# Patient Record
Sex: Female | Born: 1955
Health system: Southern US, Community
[De-identification: ages and names within clinical notes are randomized; demographics above are authoritative.]

## PROBLEM LIST (undated history)

## (undated) DIAGNOSIS — F329 Major depressive disorder, single episode, unspecified: Secondary | ICD-10-CM

## (undated) DIAGNOSIS — F32A Depression, unspecified: Secondary | ICD-10-CM

## (undated) DIAGNOSIS — D242 Benign neoplasm of left breast: Secondary | ICD-10-CM

## (undated) DIAGNOSIS — D72829 Elevated white blood cell count, unspecified: Secondary | ICD-10-CM

## (undated) DIAGNOSIS — D649 Anemia, unspecified: Secondary | ICD-10-CM

## (undated) DIAGNOSIS — M1990 Unspecified osteoarthritis, unspecified site: Secondary | ICD-10-CM

## (undated) DIAGNOSIS — Z9889 Other specified postprocedural states: Secondary | ICD-10-CM

## (undated) DIAGNOSIS — E039 Hypothyroidism, unspecified: Secondary | ICD-10-CM

## (undated) DIAGNOSIS — R112 Nausea with vomiting, unspecified: Secondary | ICD-10-CM

## (undated) HISTORY — DX: Major depressive disorder, single episode, unspecified: F32.9

## (undated) HISTORY — DX: Hypothyroidism, unspecified: E03.9

## (undated) HISTORY — DX: Elevated white blood cell count, unspecified: D72.829

## (undated) HISTORY — PX: ABDOMINAL HYSTERECTOMY: SHX81

## (undated) HISTORY — PX: COLONOSCOPY: SHX174

## (undated) HISTORY — DX: Depression, unspecified: F32.A

---

## 1997-09-30 ENCOUNTER — Ambulatory Visit (HOSPITAL_COMMUNITY): Admission: RE | Admit: 1997-09-30 | Discharge: 1997-09-30 | Payer: Self-pay | Admitting: Obstetrics and Gynecology

## 1998-10-02 ENCOUNTER — Ambulatory Visit (HOSPITAL_COMMUNITY): Admission: RE | Admit: 1998-10-02 | Discharge: 1998-10-02 | Payer: Self-pay | Admitting: Obstetrics and Gynecology

## 1998-12-21 ENCOUNTER — Encounter (INDEPENDENT_AMBULATORY_CARE_PROVIDER_SITE_OTHER): Payer: Self-pay | Admitting: *Deleted

## 1998-12-21 ENCOUNTER — Ambulatory Visit (HOSPITAL_COMMUNITY): Admission: RE | Admit: 1998-12-21 | Discharge: 1998-12-21 | Payer: Self-pay | Admitting: Obstetrics and Gynecology

## 1999-10-12 ENCOUNTER — Ambulatory Visit (HOSPITAL_COMMUNITY): Admission: RE | Admit: 1999-10-12 | Discharge: 1999-10-12 | Payer: Self-pay | Admitting: Obstetrics and Gynecology

## 1999-10-12 ENCOUNTER — Encounter: Payer: Self-pay | Admitting: Obstetrics and Gynecology

## 1999-10-30 ENCOUNTER — Encounter (INDEPENDENT_AMBULATORY_CARE_PROVIDER_SITE_OTHER): Payer: Self-pay | Admitting: Specialist

## 1999-10-30 ENCOUNTER — Observation Stay (HOSPITAL_COMMUNITY): Admission: RE | Admit: 1999-10-30 | Discharge: 1999-10-31 | Payer: Self-pay | Admitting: Obstetrics and Gynecology

## 2000-10-20 ENCOUNTER — Other Ambulatory Visit: Admission: RE | Admit: 2000-10-20 | Discharge: 2000-10-20 | Payer: Self-pay | Admitting: Obstetrics and Gynecology

## 2000-10-22 ENCOUNTER — Ambulatory Visit (HOSPITAL_COMMUNITY): Admission: RE | Admit: 2000-10-22 | Discharge: 2000-10-22 | Payer: Self-pay | Admitting: Obstetrics and Gynecology

## 2000-10-22 ENCOUNTER — Encounter: Payer: Self-pay | Admitting: Obstetrics and Gynecology

## 2001-12-01 ENCOUNTER — Ambulatory Visit (HOSPITAL_COMMUNITY): Admission: RE | Admit: 2001-12-01 | Discharge: 2001-12-01 | Payer: Self-pay | Admitting: Obstetrics and Gynecology

## 2001-12-01 ENCOUNTER — Encounter: Payer: Self-pay | Admitting: Obstetrics and Gynecology

## 2001-12-24 ENCOUNTER — Other Ambulatory Visit: Admission: RE | Admit: 2001-12-24 | Discharge: 2001-12-24 | Payer: Self-pay | Admitting: Obstetrics and Gynecology

## 2002-12-21 ENCOUNTER — Ambulatory Visit (HOSPITAL_COMMUNITY): Admission: RE | Admit: 2002-12-21 | Discharge: 2002-12-21 | Payer: Self-pay | Admitting: Obstetrics and Gynecology

## 2002-12-21 ENCOUNTER — Encounter: Payer: Self-pay | Admitting: Obstetrics and Gynecology

## 2004-01-17 ENCOUNTER — Ambulatory Visit (HOSPITAL_COMMUNITY): Admission: RE | Admit: 2004-01-17 | Discharge: 2004-01-17 | Payer: Self-pay | Admitting: Obstetrics and Gynecology

## 2004-01-26 ENCOUNTER — Encounter: Admission: RE | Admit: 2004-01-26 | Discharge: 2004-01-26 | Payer: Self-pay | Admitting: Obstetrics and Gynecology

## 2004-02-13 ENCOUNTER — Other Ambulatory Visit: Admission: RE | Admit: 2004-02-13 | Discharge: 2004-02-13 | Payer: Self-pay | Admitting: Obstetrics and Gynecology

## 2005-02-14 ENCOUNTER — Encounter: Admission: RE | Admit: 2005-02-14 | Discharge: 2005-02-14 | Payer: Self-pay | Admitting: Obstetrics and Gynecology

## 2005-06-03 ENCOUNTER — Other Ambulatory Visit: Admission: RE | Admit: 2005-06-03 | Discharge: 2005-06-03 | Payer: Self-pay | Admitting: Obstetrics and Gynecology

## 2006-07-29 ENCOUNTER — Ambulatory Visit (HOSPITAL_COMMUNITY): Admission: RE | Admit: 2006-07-29 | Discharge: 2006-07-29 | Payer: Self-pay | Admitting: Obstetrics and Gynecology

## 2006-08-06 ENCOUNTER — Encounter: Admission: RE | Admit: 2006-08-06 | Discharge: 2006-08-06 | Payer: Self-pay | Admitting: Obstetrics and Gynecology

## 2007-02-09 ENCOUNTER — Encounter: Admission: RE | Admit: 2007-02-09 | Discharge: 2007-02-09 | Payer: Self-pay | Admitting: Family Medicine

## 2007-07-30 ENCOUNTER — Encounter: Admission: RE | Admit: 2007-07-30 | Discharge: 2007-07-30 | Payer: Self-pay | Admitting: Obstetrics and Gynecology

## 2009-04-25 ENCOUNTER — Encounter: Admission: RE | Admit: 2009-04-25 | Discharge: 2009-04-25 | Payer: Self-pay | Admitting: Obstetrics and Gynecology

## 2010-07-24 ENCOUNTER — Other Ambulatory Visit: Payer: Self-pay | Admitting: Oncology

## 2010-07-24 ENCOUNTER — Encounter (HOSPITAL_BASED_OUTPATIENT_CLINIC_OR_DEPARTMENT_OTHER): Payer: BC Managed Care – PPO | Admitting: Oncology

## 2010-07-24 DIAGNOSIS — D72829 Elevated white blood cell count, unspecified: Secondary | ICD-10-CM

## 2010-07-24 LAB — COMPREHENSIVE METABOLIC PANEL
ALT: 15 U/L (ref 0–35)
AST: 15 U/L (ref 0–37)
CO2: 24 mEq/L (ref 19–32)
Calcium: 9.8 mg/dL (ref 8.4–10.5)
Chloride: 100 mEq/L (ref 96–112)
Creatinine, Ser: 0.71 mg/dL (ref 0.40–1.20)
Potassium: 3.8 mEq/L (ref 3.5–5.3)
Sodium: 138 mEq/L (ref 135–145)
Total Protein: 7.3 g/dL (ref 6.0–8.3)

## 2010-07-24 LAB — CHCC SMEAR

## 2010-07-24 LAB — CBC WITH DIFFERENTIAL/PLATELET
BASO%: 1.7 % (ref 0.0–2.0)
Basophils Absolute: 0.1 10*3/uL (ref 0.0–0.1)
Eosinophils Absolute: 0 10*3/uL (ref 0.0–0.5)
HGB: 13.6 g/dL (ref 11.6–15.9)
LYMPH%: 31.3 % (ref 14.0–49.7)
MCH: 30.9 pg (ref 25.1–34.0)
MONO%: 19.9 % — ABNORMAL HIGH (ref 0.0–14.0)
NEUT#: 2.8 10*3/uL (ref 1.5–6.5)
NEUT%: 46.3 % (ref 38.4–76.8)
RDW: 12.9 % (ref 11.2–14.5)
WBC: 6.1 10*3/uL (ref 3.9–10.3)

## 2010-07-26 ENCOUNTER — Encounter: Payer: Self-pay | Admitting: Oncology

## 2010-08-03 NOTE — Discharge Summary (Signed)
[facility]  Patient:    Teresa Floyd, Teresa Floyd                       MRN: [id number] Adm. Date:  [id number] Disc. Date: [id number] Attending:  Trevor Iha                           Discharge Summary  HISTORY OF PRESENT ILLNESS:   Ms. Teresa Floyd is a 55 year old, G4, P2, with hemorrhagic dysmenorrhea, missing two days of work at at time, feeling drained after her menses.  She has undergone a D&C with minimal success, declines oral contraceptive agents.  No treatment with anti-inflammatory medications.  She also has anemia.  She presents today for laparoscopic-assisted vaginal hysterectomy and bilateral salpingo-oophorectomy.  HOSPITAL COURSE:              The patient underwent LAVH-BSO with also lysis of adhesions.  The surgery was uncomplicated with estimated blood loss of 350 cc.  Her postoperative course was complicated only by a postoperative hemoglobin of 9.4, but platelets the following morning returned as 113,000 which was down from her preoperative platelets of 418,000.  Recheck later that evening showed platelets returned to 393,000, and the hemoglobin was 8.9.  She did have one temperature Teresa Floyd of 100.7 on the afternoon of October 31, 1999, but rapidly defervesced after ambulating and no signs of infection.  Bowel sounds were positive.  She tolerated a regular diet without difficulty and was ambulating well, and the patient was discharged home.  DISPOSITION:                  The patient will follow up in the hospital in two days for recheck of her CBC.  She was sent home with an prescription for Darvocet #30, Anaprox double strength, continue Climara 0.1 mg per day patch and continue her iron which she already has at home. DD:  10/31/99 TD:  11/01/99 Job: [id number]

## 2010-08-03 NOTE — Op Note (Signed)
[facility]  Patient:    Teresa Floyd, Teresa Floyd                       MRN: [id number] Proc. Date: 10/30/99 Adm. Date:  [id number] Disc. Date: [id number] Attending:  Trevor Iha                           Operative Report  PREOPERATIVE DIAGNOSIS:       Menorrhagia, dysmenorrhea and positive for fibroids.  POSTOPERATIVE DIAGNOSES:      1. Menorrhagia, dysmenorrhea and positive for                                  fibroids.                               2. Pelvic adhesive disease.  OPERATION:                    Laparoscopically assisted hysterectomy and bilateral salpingo-oophorectomy with lysis of adhesions.  SURGEON:                      Trevor Iha, M.D.  ASSISTANT:                    Duke Salvia. Marcelle Overlie, M.D.  ANESTHESIA:                   General endotracheal.  INDICATIONS:                  Teresa Floyd is a 55 year old G4, P2 with worsening menorrhagia, dysmenorrhea which is incapacitating for her not responsive to conservative medical management, also anemia. The patient desires definitive surgical intervention and presents today for a LAVHBSO. Risks and benefits were discussed at length.  Informed consent was obtained. See history and physical for further details.  FINDINGS:                     There were adhesions from the bladder to the uterus anteriorly and also the descending colon and epiploica were adhesed to the left tubo-ovarian complex.  This was sharply dissected.  Otherwise, there was a grossly enlarged uterus, normal-appearing ovaries and tubes, normal-appearing appendix and liver.  DESCRIPTION OF PROCEDURE:     After adequate general anesthesia, the patient was placed in the dorsal lithotomy position.  She was sterilely prepped and draped. Bladder was sterilely drained.  Hulka tenaculum was placed in the cervix.  A 1 cm infraumbilical skin incision was made and a Veress needle was inserted.  The abdomen was insufflated  with dullness to percussion.  An 11 mm trocar was inserted and two 5 mm trocars were inserted to the left of the midline to the pubic symphysis under direct visualization.  The above findings were noted.  Sharp endoshears were used to dissect the epiploica and bowel from the left tubo-ovarian complex and also to dissect the bladder from the anterior surface of the uterus and cervix.  At this time, endo-GIA was placed through the umbilical trocar.  A 5 mm laparoscope was inserted.  The right infundibulopelvic ligament was identified and the endo-GIA was placed across with care taken to avoid the ovary and the ureter.  After good placement was noted, the endo-GIA  was fired with good results noted.  A second endo-GIA was placed across the infundibulopelvic ligament down across the round ligament with good placement noted.  This was done similarly across the left infundibulopelvic ligament and the second endo-GIA was fired across the broad ligament down to the round ligament with good placement and care taken to avoid the ureter.  At this time, the abdomen was desufflated and we proceeded with the vaginal portion of the case.  A Jacobs tenaculum was placed on the posterior cervix.  A weighted speculum was placed in the vagina and a posterior colpotomy was performed. The cervix was circumscribed with Bovie cautery.  Heaney clips were placed across the uterosacral ligaments bilaterally. They were clamped, cut and tied using 0 Monocryl sutures.  The cardinal ligaments were then clamped, cut and tied bilaterally with 0 Monocryl.  The bladder was dissected over the anterior cervix.  The peritoneum was entered sharply and a Christain Sacramento was placed underneath the bladder and brought up through the ______, and then clamped, cut and tied again using 0 Monocryl.  Heaney clamps were then placed across the uterine vasculature, bilaterally clamped, cut and tied and across the inferior portion of the round  ligaments bilaterally.  They were clamped, cut and tied.  At this point, the tenaculum was placed on the fundus of the uterus.  It was delivered into the vagina and Heaney clamps were placed across the remaining portions of the broad ligament bilaterally.  They were clamped, cut and tied.  The uterus, tubes and ovaries were removed.  A wet pack was placed.  Good hemostasis was noted.  A suture of 0 Monocryl was placed across the uterosacral ligament bilaterally, clamped, cut and tied.  The posterior peritoneum was then closed in a pursestring fashion with 0 Monocryl.  The vagina was then closed sterilely in the inferior section in a vertical fashion with 0 Monocryl in a figure-of-eight fashion with good approximation.  The wet pack was then removed and the anterior portion of the vagina was then closed in a vertical fashion using figure-of-eight of 0 Monocryl with good approximation and good hemostasis.  The abdomen was reinsufflated.  Examination with the laparoscope revealed some peritoneal edges that were not hemostatic.  Hemostasis was achieved with bipolar cautery.  After good hemostasis was achieved and a copious amount of irrigation, the laparoscope was removed.  The skin incision was closed with 0 Vicryl on the fascia and 3-0 Vicryl Rapide subcuticular stitch.  The 5 mm trocar sites were closed with 3-0 Vicryl Rapide subcuticular stitch.  The incision was injected with 0.25% Marcaine.  The patient tolerated the procedure well and was stable on transfer to the recovery room.  The sponge and instrument counts were correct x 3.  The patient received 400 mg of Cipro IV preoperatively.  Estimated blood loss during the procedure was 350 cc. DD:  10/30/99 TD:  10/30/99 Job: [id number]

## 2010-08-03 NOTE — H&P (Signed)
[facility]  Patient:    Teresa Floyd, Teresa Floyd                         MRN: [id number] Adm. Date:  10/29/99 Attending:  Trevor Iha, M.D.                         History and Physical  HISTORY OF PRESENT ILLNESS:   [name] is a 55 year old, G4, P2 with continuing menorrhagia/dysmenorrhea.  She bleeds very heavily and has had much discomfort so much that she misses 1-2 days of work every time she has her period, feels drained after her period.  She has undergone D&C with minimal success.  She declines oral contraceptive agents, has no improvement with anti-inflammatory medications.  She does have a normal TSH.  She is currently on iron.  Does have a history of anemia due to the menorrhagia.  Her last hemoglobin was 11.0 on iron.  She presents for definitive surgical intervention.  Planned to proceed with laparoscopically-assisted vaginal hysterectomy with bilateral salpingo-oophorectomy.  PAST MEDICAL HISTORY:         Significant for a generalized anxiety disorder, doing well on Prozac.  PAST SURGICAL HISTORY:        She has had two cesarean sections and a tubal ligation.  She has also had several D&Cs in the past.  The most recent in October 2000.  MEDICATIONS:                  Prozac 20 mg a day, anti-inflammatory medications as needed.  SHE IS ALLERGIC TO PENICILLIN.  PHYSICAL EXAMINATION:  VITALS:                       Blood pressure is 100/68.  Weight is 175.  HEART:                        Regular rate and rhythm.  LUNGS:                        Clear to auscultation bilaterally.  ABDOMEN:                      Nondistended, nontender.  EXTREMITIES:                  Without clubbing, cyanosis, or edema.  GU:                           Normal external genitalia, Bartholin, Skene, urethra.  Uterus is anteverted ______ approximately 8-10 weeks size, good descensus with Valsalva.  Ultrasound shows an 11 x 6.1 x 4.7 cm uterus with multiple  fibroids noted within the wall, the largest measuring 2.5 cm in size. Normal-appearing ovaries.  No fluid in the cul-de-sac.  IMPRESSION/PLAN:              Probable fibroids, menorrhagia, dysmenorrhea, not responsive to conservative medical management.  Patient desires definitive surgical intervention, requests hysterectomy.  Lengthy discussion regarding bilateral salpingo-oophorectomy.  Patient, after having a long discussion regarding risks and benefits of removing the ovaries at age 86, she does desire to proceed with this.  Also have had a lengthy discussion with the patient with regards to hormone replacement therapy and she will start on hormones after the surgery.  Risks and  benefits were discussed at length, including, but not limited to risks of infection, bleeding, damage to bowel, bladder, ureters.  Also risk of blood loss, including risks associated with blood transfusion.  Patient does give her informed consent. DD:  10/29/99 TD:  10/29/99 Job: [id number]

## 2010-12-25 ENCOUNTER — Encounter: Payer: Self-pay | Admitting: *Deleted

## 2011-01-23 ENCOUNTER — Encounter: Payer: Self-pay | Admitting: *Deleted

## 2011-02-02 ENCOUNTER — Telehealth: Payer: Self-pay | Admitting: Oncology

## 2011-02-02 NOTE — Telephone Encounter (Signed)
Called pt to give new appt 03/26/11 @ 8.30am. Pt's phn rang then recording came on advising pt's vm box is full. I will mail pt an appt calendar for Jan 2013.

## 2011-03-18 ENCOUNTER — Encounter: Payer: Self-pay | Admitting: *Deleted

## 2011-03-22 ENCOUNTER — Other Ambulatory Visit: Payer: Self-pay | Admitting: Obstetrics and Gynecology

## 2011-03-22 DIAGNOSIS — N644 Mastodynia: Secondary | ICD-10-CM

## 2011-03-22 DIAGNOSIS — Z1231 Encounter for screening mammogram for malignant neoplasm of breast: Secondary | ICD-10-CM

## 2011-03-26 ENCOUNTER — Encounter: Payer: BC Managed Care – PPO | Admitting: Oncology

## 2011-03-26 ENCOUNTER — Other Ambulatory Visit: Payer: BC Managed Care – PPO | Admitting: Lab

## 2011-03-26 NOTE — Progress Notes (Deleted)
Hematology and Oncology Follow Up Visit  Teresa Floyd [id number] 11-May-1955 56 y.o. 03/26/2011 8:19 AM No primary provider on file.No ref. provider found   Principle Diagnosis: 56 year-old female with very slight monocytosis   Prior Therapy: none  Current therapy: observation  Interim History:   Medications: {medication reviewed/display:3041432} Current outpatient prescriptions:buPROPion (WELLBUTRIN SR) 100 MG 12 hr tablet, Take 100 mg by mouth daily.  , Disp: , Rfl: ;  estradiol (ESTRACE) 0.1 MG/GM vaginal cream, Place 2 g vaginally daily.  , Disp: , Rfl: ;  Estradiol (VIVELLE TD), Place onto the skin.  , Disp: , Rfl: ;  FLUoxetine (PROZAC) 10 MG tablet, Take 10 mg by mouth daily.  , Disp: , Rfl:  levothyroxine (SYNTHROID, LEVOTHROID) 125 MCG tablet, Take 125 mcg by mouth daily.  , Disp: , Rfl: ;  valACYclovir (VALTREX) 500 MG tablet, Take 500 mg by mouth daily.  , Disp: , Rfl:   Allergies:  Allergies  Allergen Reactions  . Penicillins     Past Medical History, Surgical history, Social history, and Family History were reviewed and updated.  Review of Systems: Constitutional:  Negative for fever, chills, night sweats, anorexia, weight loss, pain. Cardiovascular: {roscv:310661} Respiratory: {ros resp:310659} Neurological: {rosneuro:310663} Dermatological: {ros skin:310673} ENT: {rosent:310657} Skin: Negative. Gastrointestinal: {ros gi:310669} Genito-Urinary: {rosgu:310671} Hematological and Lymphatic: {rosheme/lymph:310665} Breast: {ros breast:311036} Musculoskeletal: {ros musculoskeletal:310677} Remaining ROS negative.  Physical Exam:  There were no vitals taken for this visit. ECOG:  General appearance: {general exam:16600} Head: {head exam:30909::"Normocephalic, without obvious abnormality","atraumatic"} Neck: {neck exam:17463::"no adenopathy","no carotid bruit","no JVD","supple, symmetrical, trachea midline","thyroid not enlarged, symmetric, no  tenderness/mass/nodules"} Lymph nodes: {lymph node exam:14039::"Cervical, supraclavicular, and axillary nodes normal."} Heart:{Exam; heart:5510} Lung:{Exam; lungs:5033::"Heart exam - S1, S2 normal, no murmur, no gallop, rate regular"} Abdomin: {Exam; abdomen:14935::"soft, non-tender, without masses or organomegaly"} EXT:{EXTREMITIES NEGATIVES KGMW:10272}   Lab Results:  No results found for this basename: WBC:5,HGB:5,HCT:5,PLT:5,MCV:5,MCH:5,MCHC:5,RDW:5,NEUTRABS:5,LYMPHSABS:5,MONOABS:5,EOSABS:5,BASOSABS:5,BANDABS:5,BANDSABD:5 in the last 168 hours  CMP   No results found for this basename: NA:5,K:5,CL:5,CO2:5,GLUCOSE:5,BUN:5,CREATININE:5,GFRCGP,:5,CALCIUM:5,MG:5,AST:5,ALT:5,ALKPHOS:5,BILITOT:5 in the last 168 hours      Component Value Date/Time   BILITOT 0.4 07/24/2010 1307    Radiological Studies:  No results found.   Impression and Plan:  ***  Spent more than half the time coordinating care.    Marcos Eke, MD 1/8/20138:19 AM

## 2011-03-27 NOTE — Progress Notes (Signed)
This encounter was created in error - please disregard.

## 2011-04-01 ENCOUNTER — Ambulatory Visit: Payer: BC Managed Care – PPO

## 2011-07-15 ENCOUNTER — Ambulatory Visit
Admission: RE | Admit: 2011-07-15 | Discharge: 2011-07-15 | Disposition: A | Discharge status: Home / Self Care | Payer: BC Managed Care – PPO | Source: Ambulatory Visit | Attending: Obstetrics and Gynecology | Admitting: Obstetrics and Gynecology

## 2011-07-15 DIAGNOSIS — N644 Mastodynia: Secondary | ICD-10-CM

## 2011-07-19 ENCOUNTER — Telehealth: Payer: Self-pay | Admitting: Oncology

## 2011-07-19 NOTE — Telephone Encounter (Signed)
Returned pt's call @ both numbers listed in EPIC re r/s appt but was not able to reach her. Pt asked to call back and let me know when she can come in.

## 2011-07-23 ENCOUNTER — Telehealth: Payer: Self-pay | Admitting: Oncology

## 2011-07-23 NOTE — Telephone Encounter (Signed)
l/m at wk and cell [phone],pt had l/m to set up appt  aom

## 2011-07-28 ENCOUNTER — Emergency Department (HOSPITAL_BASED_OUTPATIENT_CLINIC_OR_DEPARTMENT_OTHER)
Admission: EM | Admit: 2011-07-28 | Discharge: 2011-07-28 | Disposition: A | Discharge status: Home / Self Care | Payer: BC Managed Care – PPO | Attending: Emergency Medicine | Admitting: Emergency Medicine

## 2011-07-28 ENCOUNTER — Encounter (HOSPITAL_BASED_OUTPATIENT_CLINIC_OR_DEPARTMENT_OTHER): Payer: Self-pay | Admitting: *Deleted

## 2011-07-28 DIAGNOSIS — R21 Rash and other nonspecific skin eruption: Secondary | ICD-10-CM | POA: Insufficient documentation

## 2011-07-28 DIAGNOSIS — E039 Hypothyroidism, unspecified: Secondary | ICD-10-CM | POA: Insufficient documentation

## 2011-07-28 DIAGNOSIS — Z79899 Other long term (current) drug therapy: Secondary | ICD-10-CM | POA: Insufficient documentation

## 2011-07-28 DIAGNOSIS — L237 Allergic contact dermatitis due to plants, except food: Secondary | ICD-10-CM

## 2011-07-28 MED ORDER — PREDNISONE 50 MG PO TABS
60.0000 mg | ORAL_TABLET | Freq: Once | ORAL | Status: AC
  Administered 2011-07-28: 60 mg via ORAL
  Filled 2011-07-28: qty 1

## 2011-07-28 MED ORDER — PREDNISONE 10 MG PO TABS: 20.0000 mg | ORAL_TABLET | Freq: Every day | ORAL | Status: DC

## 2011-07-28 MED ORDER — DIPHENHYDRAMINE HCL 25 MG PO CAPS
25.0000 mg | ORAL_CAPSULE | Freq: Once | ORAL | Status: AC
  Administered 2011-07-28: 25 mg via ORAL
  Filled 2011-07-28 (×2): qty 1

## 2011-07-28 NOTE — ED Notes (Signed)
Pt presents to ED today with generalized rash over body.  Pt reports strong reaction to poision ivy and has hx of same.  Pt is requesting "steroid to help treat this"

## 2011-07-28 NOTE — ED Provider Notes (Signed)
History   This chart was scribed for Hilario Quarry, MD by Charolett Bumpers . The patient was seen in room MH11/MH11.    CSN: [id number]  Arrival date & time 07/28/11  2139   First MD Initiated Contact with Patient 07/28/11 2219      Chief Complaint  Patient presents with  . Rash    (Consider location/radiation/quality/duration/timing/severity/associated sxs/prior treatment) HPI Teresa Floyd is a 56 y.o. female who presents to the Emergency Department complaining of constant, moderate poison ivy rash on face, hands, forearms, and lower legs bilaterally. Patient states that she first noticed the rash 48 hours ago. Patient reports a h/o poison ivy outbreaks. Patient states that the rash has continued to spread and is worsening. Patient states that she took a generic antihistamine with some relief. Patient denies any pertinent medical hx or surgical hx.   PCP: Dr Tenny Craw  Past Medical History  Diagnosis Date  . Leukocytosis   . Hypothyroidism   . Depression   . Genital herpes     Past Surgical History  Procedure Date  . Abdominal hysterectomy     History reviewed. No pertinent family history.  History  Substance Use Topics  . Smoking status: Never Smoker   . Smokeless tobacco: Not on file  . Alcohol Use: No    OB History    Grav Para Term Preterm Abortions TAB SAB Ect Mult Living                  Review of Systems  Skin: Positive for rash.  All other systems reviewed and are negative.    Allergies  Penicillins  Home Medications   Current Outpatient Rx  Name Route Sig Dispense Refill  . BUPROPION HCL ER (SR) 100 MG PO TB12 Oral Take 100 mg by mouth daily.      Marland Kitchen ESTRADIOL 0.1 MG/GM VA CREA Vaginal Place 2 g vaginally daily.      Marland Kitchen ESTRADIOL 0.05 MG/24HR TD PTTW Transdermal Place 1 patch onto the skin once a week.    Marland Kitchen FLUOXETINE HCL 10 MG PO TABS Oral Take 10 mg by mouth daily.      Marland Kitchen LEVOTHYROXINE SODIUM 125 MCG PO TABS Oral Take 125 mcg by mouth  daily.      Marland Kitchen VALACYCLOVIR HCL 500 MG PO TABS Oral Take 500 mg by mouth daily.        BP 129/85  Pulse 94  Temp(Src) 98.3 F (36.8 C) (Oral)  Resp 20  Ht 5\' 3"  (1.6 m)  Wt 170 lb (77.111 kg)  BMI 30.11 kg/m2  SpO2 98%  Physical Exam  Nursing note and vitals reviewed. Constitutional: She is oriented to person, place, and time. She appears well-developed and well-nourished. No distress.  HENT:  Head: Normocephalic and atraumatic.  Eyes: EOM are normal. Pupils are equal, round, and reactive to light.  Neck: Normal range of motion. Neck supple. No tracheal deviation present.  Cardiovascular: Normal rate.   Pulmonary/Chest: Effort normal. No respiratory distress.  Abdominal: Soft. She exhibits no distension.  Musculoskeletal: Normal range of motion. She exhibits no edema.  Neurological: She is alert and oriented to person, place, and time. No sensory deficit.  Skin: Skin is warm and dry. Rash noted.       Rash on face, hands, forearms, and lower legs bilaterally consistent with poison ivy. Erythema and blisters noted throughout rash.   Psychiatric: She has a normal mood and affect. Her behavior is normal.  ED Course  Procedures (including critical care time)  DIAGNOSTIC STUDIES: Oxygen Saturation is 98% on room air, normal by my interpretation.    COORDINATION OF CARE:  2226: Discussed planned course of treatment with the patient who is agreeable at this time.  2230: Medication Orders: Diphenhydramine (Benadryl) capsule 25 mg-once; Prednisone (Deltasone) tablet 60 mg-once.   Labs Reviewed - No data to display No results found.   No diagnosis found.    MDM  I personally performed the services described in this documentation, which was scribed in my presence. The recorded information has been reviewed and considered.     Hilario Quarry, MD 07/28/11 [phone]

## 2011-07-28 NOTE — Discharge Instructions (Signed)
Poison Ivy Poison ivy is a rash caused by touching the leaves of the poison ivy plant. The rash often shows up 48 hours later. You might just have bumps, redness, and itching. Sometimes, blisters appear and break open. Your eyes may get puffy (swollen). Poison ivy often heals in 2 to 3 weeks without treatment. HOME CARE  If you touch poison ivy:   Wash your skin with soap and water right away. Wash under your fingernails. Do not rub the skin very hard.   Wash any clothes you were wearing.   Avoid poison ivy in the future. Poison ivy has 3 leaves on a stem.   Use medicine to help with itching as told by your doctor. Do not drive when you take this medicine.   Keep open sores dry, clean, and covered with a bandage and medicated cream, if needed.   Ask your doctor about medicine for children.  GET HELP RIGHT AWAY IF:  You have open sores.   Redness spreads beyond the area of the rash.   There is yellowish white fluid (pus) coming from the rash.   Pain gets worse.   You have a temperature by mouth above 102 F (38.9 C), not controlled by medicine.  MAKE SURE YOU:  Understand these instructions.   Will watch your condition.   Will get help right away if you are not doing well or get worse.  Document Released: 04/06/2010 Document Revised: 02/21/2011 Document Reviewed: 04/06/2010 [employer] Patient Information 2012 [employer]. 

## 2011-07-28 NOTE — ED Notes (Signed)
"  Poison ivy outbreak x 48 hours"

## 2011-08-01 ENCOUNTER — Telehealth: Payer: Self-pay | Admitting: Oncology

## 2011-08-01 NOTE — Telephone Encounter (Signed)
S/w pt re new appt for 5/21. appt r/s from jan per pt.

## 2011-08-06 ENCOUNTER — Ambulatory Visit (HOSPITAL_BASED_OUTPATIENT_CLINIC_OR_DEPARTMENT_OTHER): Payer: BC Managed Care – PPO | Admitting: Oncology

## 2011-08-06 ENCOUNTER — Other Ambulatory Visit: Payer: Self-pay | Admitting: Oncology

## 2011-08-06 ENCOUNTER — Other Ambulatory Visit: Payer: BC Managed Care – PPO | Admitting: Lab

## 2011-08-06 VITALS — BP 128/87 | HR 90 | Temp 98.4°F | Ht 61.5 in | Wt 164.2 lb

## 2011-08-06 DIAGNOSIS — D72821 Monocytosis (symptomatic): Secondary | ICD-10-CM

## 2011-08-06 LAB — CBC WITH DIFFERENTIAL/PLATELET
Eosinophils Absolute: 0 10*3/uL (ref 0.0–0.5)
HCT: 39.1 % (ref 34.8–46.6)
LYMPH%: 13.3 % — ABNORMAL LOW (ref 14.0–49.7)
MCH: 30.3 pg (ref 25.1–34.0)
MONO#: 0.5 10*3/uL (ref 0.1–0.9)
NEUT#: 7.8 10*3/uL — ABNORMAL HIGH (ref 1.5–6.5)
Platelets: 262 10*3/uL (ref 145–400)
lymph#: 1.3 10*3/uL (ref 0.9–3.3)
nRBC: 0 % (ref 0–0)

## 2011-08-06 LAB — CHCC SMEAR

## 2011-08-06 NOTE — Progress Notes (Signed)
Hematology and Oncology Follow Up Visit  Teresa Floyd [id number] 1955-12-01 56 y.o. 08/06/2011 3:47 PM   Principle Diagnosis: This is a 56 year old female with very slight monocytosis. Likely reactive noted in 07/2010.  Interim History:  Teresa Floyd presents for a follow up visit. I saw her back in 07/2011 and presented with monocytosis without any other hematological abnormalities. Her total WBC was normal. Since her last visit she has been doing well. She was diagnosed with poison IVY and currently on prednisone. No other complaints at this time. No recent infections or hospitalizations. No weight loss or any other symptoms.   Medications: I have reviewed the patient's current medications. Current outpatient prescriptions:buPROPion (WELLBUTRIN SR) 100 MG 12 hr tablet, Take 100 mg by mouth daily.  , Disp: , Rfl: ;  estradiol (ESTRACE) 0.1 MG/GM vaginal cream, Place 2 g vaginally daily.  , Disp: , Rfl: ;  estradiol (VIVELLE-DOT) 0.05 MG/24HR, Place 1 patch onto the skin once a week., Disp: , Rfl: ;  FLUoxetine (PROZAC) 10 MG tablet, Take 10 mg by mouth daily.  , Disp: , Rfl:  levothyroxine (SYNTHROID, LEVOTHROID) 125 MCG tablet, Take 125 mcg by mouth daily.  , Disp: , Rfl: ;  predniSONE (DELTASONE) 10 MG tablet, Take 2 tablets (20 mg total) by mouth daily., Disp: 15 tablet, Rfl: 0;  valACYclovir (VALTREX) 500 MG tablet, Take 500 mg by mouth daily.  , Disp: , Rfl:   Allergies:  Allergies  Allergen Reactions  . Penicillins Rash    Past Medical History, Surgical history, Social history, and Family History were reviewed and updated.  Review of Systems: Constitutional:  Negative for fever, chills, night sweats, anorexia, weight loss, pain. Cardiovascular: no chest pain or dyspnea on exertion Respiratory: no cough, shortness of breath, or wheezing Neurological: no TIA or stroke symptoms Dermatological: negative ENT: negative Skin: Negative. Gastrointestinal: no abdominal pain, change in bowel  habits, or black or bloody stools Genito-Urinary: no dysuria, trouble voiding, or hematuria Hematological and Lymphatic: negative Breast: negative Musculoskeletal: negative Remaining ROS negative. Physical Exam: Blood pressure 128/87, pulse 90, temperature 98.4 F (36.9 C), temperature source Oral, height 5' 1.5" (1.562 m), weight 164 lb 4 oz (74.503 kg). ECOG: 0 General appearance: alert Head: Normocephalic, without obvious abnormality, atraumatic Neck: no adenopathy, no carotid bruit, no JVD, supple, symmetrical, trachea midline and thyroid not enlarged, symmetric, no tenderness/mass/nodules Lymph nodes: Cervical, supraclavicular, and axillary nodes normal. Heart:regular rate and rhythm, S1, S2 normal, no murmur, click, rub or gallop Lung:chest clear, no wheezing, rales, normal symmetric air entry Abdomin: soft, non-tender, without masses or organomegaly EXT:no erythema, induration, or nodules   Lab Results: Lab Results  Component Value Date   WBC 9.7 08/06/2011   HGB 13.2 08/06/2011   HCT 39.1 08/06/2011   MCV 89.8 08/06/2011   PLT 262 08/06/2011     Chemistry      Component Value Date/Time   NA 138 07/24/2010 1307   K 3.8 07/24/2010 1307   CL 100 07/24/2010 1307   CO2 24 07/24/2010 1307   BUN 20 07/24/2010 1307   CREATININE 0.71 07/24/2010 1307      Component Value Date/Time   CALCIUM 9.8 07/24/2010 1307   ALKPHOS 62 07/24/2010 1307   AST 15 07/24/2010 1307   ALT 15 07/24/2010 1307   BILITOT 0.4 07/24/2010 1307       Impression and Plan:   This is a pleasant 56 year old female with very slight monocytosis which has resolved at this time.  Her  CBC today did show some mild neutrophilia likely related to prednisone at this time.  Overall, I feel there is no hematological condition here and likely reactive changes.  I will be happy to see her in the future as needed.     Eli Hose, MD 5/21/20133:47 PM

## 2012-06-25 ENCOUNTER — Other Ambulatory Visit: Payer: Self-pay

## 2012-06-25 DIAGNOSIS — Z1231 Encounter for screening mammogram for malignant neoplasm of breast: Secondary | ICD-10-CM

## 2012-07-27 ENCOUNTER — Ambulatory Visit: Payer: BC Managed Care – PPO

## 2012-08-31 ENCOUNTER — Ambulatory Visit: Payer: BC Managed Care – PPO

## 2012-10-05 ENCOUNTER — Ambulatory Visit: Payer: BC Managed Care – PPO

## 2012-10-26 ENCOUNTER — Ambulatory Visit: Payer: BC Managed Care – PPO

## 2012-11-23 ENCOUNTER — Ambulatory Visit
Admission: RE | Admit: 2012-11-23 | Discharge: 2012-11-23 | Disposition: A | Discharge status: Home / Self Care | Payer: BC Managed Care – PPO | Source: Ambulatory Visit

## 2012-11-23 DIAGNOSIS — Z1231 Encounter for screening mammogram for malignant neoplasm of breast: Secondary | ICD-10-CM

## 2012-11-25 ENCOUNTER — Other Ambulatory Visit: Payer: Self-pay | Admitting: Obstetrics and Gynecology

## 2012-11-25 DIAGNOSIS — R928 Other abnormal and inconclusive findings on diagnostic imaging of breast: Secondary | ICD-10-CM

## 2012-12-08 ENCOUNTER — Ambulatory Visit
Admission: RE | Admit: 2012-12-08 | Discharge: 2012-12-08 | Disposition: A | Discharge status: Home / Self Care | Payer: BC Managed Care – PPO | Source: Ambulatory Visit | Attending: Obstetrics and Gynecology | Admitting: Obstetrics and Gynecology

## 2012-12-08 DIAGNOSIS — R928 Other abnormal and inconclusive findings on diagnostic imaging of breast: Secondary | ICD-10-CM

## 2013-12-15 ENCOUNTER — Other Ambulatory Visit: Payer: Self-pay

## 2013-12-15 DIAGNOSIS — Z1231 Encounter for screening mammogram for malignant neoplasm of breast: Secondary | ICD-10-CM

## 2013-12-23 ENCOUNTER — Ambulatory Visit
Admission: RE | Admit: 2013-12-23 | Discharge: 2013-12-23 | Disposition: A | Discharge status: Home / Self Care | Payer: BC Managed Care – PPO | Source: Ambulatory Visit

## 2013-12-23 DIAGNOSIS — Z1231 Encounter for screening mammogram for malignant neoplasm of breast: Secondary | ICD-10-CM

## 2015-02-13 ENCOUNTER — Other Ambulatory Visit: Payer: Self-pay

## 2015-02-13 DIAGNOSIS — Z1231 Encounter for screening mammogram for malignant neoplasm of breast: Secondary | ICD-10-CM

## 2015-03-22 ENCOUNTER — Ambulatory Visit
Admission: RE | Admit: 2015-03-22 | Discharge: 2015-03-22 | Disposition: A | Discharge status: Home / Self Care | Payer: BLUE CROSS/BLUE SHIELD | Source: Ambulatory Visit

## 2015-03-22 DIAGNOSIS — Z1231 Encounter for screening mammogram for malignant neoplasm of breast: Secondary | ICD-10-CM

## 2016-01-10 DIAGNOSIS — F4321 Adjustment disorder with depressed mood: Secondary | ICD-10-CM | POA: Diagnosis not present

## 2016-02-05 DIAGNOSIS — Z86010 Personal history of colonic polyps: Secondary | ICD-10-CM | POA: Diagnosis not present

## 2016-02-05 DIAGNOSIS — K5730 Diverticulosis of large intestine without perforation or abscess without bleeding: Secondary | ICD-10-CM | POA: Diagnosis not present

## 2016-03-19 ENCOUNTER — Other Ambulatory Visit: Payer: Self-pay | Admitting: Obstetrics and Gynecology

## 2016-03-19 DIAGNOSIS — Z1231 Encounter for screening mammogram for malignant neoplasm of breast: Secondary | ICD-10-CM

## 2016-04-11 ENCOUNTER — Ambulatory Visit: Payer: BLUE CROSS/BLUE SHIELD

## 2016-04-25 DIAGNOSIS — Z6829 Body mass index (BMI) 29.0-29.9, adult: Secondary | ICD-10-CM | POA: Diagnosis not present

## 2016-04-25 DIAGNOSIS — Z01419 Encounter for gynecological examination (general) (routine) without abnormal findings: Secondary | ICD-10-CM | POA: Diagnosis not present

## 2016-04-25 DIAGNOSIS — E039 Hypothyroidism, unspecified: Secondary | ICD-10-CM | POA: Diagnosis not present

## 2016-05-13 ENCOUNTER — Ambulatory Visit
Admission: RE | Admit: 2016-05-13 | Discharge: 2016-05-13 | Disposition: A | Discharge status: Home / Self Care | Payer: BLUE CROSS/BLUE SHIELD | Source: Ambulatory Visit | Attending: Obstetrics and Gynecology | Admitting: Obstetrics and Gynecology

## 2016-05-13 DIAGNOSIS — Z1231 Encounter for screening mammogram for malignant neoplasm of breast: Secondary | ICD-10-CM | POA: Diagnosis not present

## 2016-06-19 DIAGNOSIS — M25531 Pain in right wrist: Secondary | ICD-10-CM | POA: Diagnosis not present

## 2016-06-19 DIAGNOSIS — M79646 Pain in unspecified finger(s): Secondary | ICD-10-CM | POA: Diagnosis not present

## 2016-07-17 DIAGNOSIS — M25531 Pain in right wrist: Secondary | ICD-10-CM | POA: Diagnosis not present

## 2016-07-17 DIAGNOSIS — M79646 Pain in unspecified finger(s): Secondary | ICD-10-CM | POA: Diagnosis not present

## 2016-08-06 DIAGNOSIS — M79646 Pain in unspecified finger(s): Secondary | ICD-10-CM | POA: Diagnosis not present

## 2016-08-06 DIAGNOSIS — M25531 Pain in right wrist: Secondary | ICD-10-CM | POA: Diagnosis not present

## 2016-08-20 DIAGNOSIS — M25531 Pain in right wrist: Secondary | ICD-10-CM | POA: Diagnosis not present

## 2016-08-20 DIAGNOSIS — M79644 Pain in right finger(s): Secondary | ICD-10-CM | POA: Diagnosis not present

## 2016-08-20 DIAGNOSIS — M25532 Pain in left wrist: Secondary | ICD-10-CM | POA: Diagnosis not present

## 2016-08-20 DIAGNOSIS — M79646 Pain in unspecified finger(s): Secondary | ICD-10-CM | POA: Diagnosis not present

## 2016-09-02 DIAGNOSIS — M25531 Pain in right wrist: Secondary | ICD-10-CM | POA: Diagnosis not present

## 2016-09-02 DIAGNOSIS — M79646 Pain in unspecified finger(s): Secondary | ICD-10-CM | POA: Diagnosis not present

## 2016-09-11 DIAGNOSIS — J019 Acute sinusitis, unspecified: Secondary | ICD-10-CM | POA: Diagnosis not present

## 2016-10-29 DIAGNOSIS — M25511 Pain in right shoulder: Secondary | ICD-10-CM | POA: Diagnosis not present

## 2016-10-29 DIAGNOSIS — M25539 Pain in unspecified wrist: Secondary | ICD-10-CM | POA: Diagnosis not present

## 2016-12-30 DIAGNOSIS — R0789 Other chest pain: Secondary | ICD-10-CM | POA: Diagnosis not present

## 2017-01-13 DIAGNOSIS — E039 Hypothyroidism, unspecified: Secondary | ICD-10-CM | POA: Diagnosis not present

## 2017-01-13 DIAGNOSIS — R0789 Other chest pain: Secondary | ICD-10-CM | POA: Diagnosis not present

## 2017-01-13 DIAGNOSIS — R079 Chest pain, unspecified: Secondary | ICD-10-CM | POA: Diagnosis not present

## 2017-02-12 DIAGNOSIS — J069 Acute upper respiratory infection, unspecified: Secondary | ICD-10-CM | POA: Diagnosis not present

## 2017-02-18 DIAGNOSIS — M25531 Pain in right wrist: Secondary | ICD-10-CM | POA: Diagnosis not present

## 2017-03-03 DIAGNOSIS — J069 Acute upper respiratory infection, unspecified: Secondary | ICD-10-CM | POA: Diagnosis not present

## 2017-03-03 DIAGNOSIS — J0100 Acute maxillary sinusitis, unspecified: Secondary | ICD-10-CM | POA: Diagnosis not present

## 2017-03-17 DIAGNOSIS — J019 Acute sinusitis, unspecified: Secondary | ICD-10-CM | POA: Diagnosis not present

## 2017-03-25 DIAGNOSIS — M25539 Pain in unspecified wrist: Secondary | ICD-10-CM | POA: Diagnosis not present

## 2017-03-27 DIAGNOSIS — R05 Cough: Secondary | ICD-10-CM | POA: Diagnosis not present

## 2017-03-31 DIAGNOSIS — R05 Cough: Secondary | ICD-10-CM | POA: Diagnosis not present

## 2017-03-31 DIAGNOSIS — J320 Chronic maxillary sinusitis: Secondary | ICD-10-CM | POA: Diagnosis not present

## 2017-04-01 DIAGNOSIS — M25539 Pain in unspecified wrist: Secondary | ICD-10-CM | POA: Diagnosis not present

## 2017-04-07 ENCOUNTER — Other Ambulatory Visit: Payer: Self-pay | Admitting: Obstetrics and Gynecology

## 2017-04-07 DIAGNOSIS — Z139 Encounter for screening, unspecified: Secondary | ICD-10-CM

## 2017-04-08 DIAGNOSIS — M25539 Pain in unspecified wrist: Secondary | ICD-10-CM | POA: Diagnosis not present

## 2017-04-15 DIAGNOSIS — M25539 Pain in unspecified wrist: Secondary | ICD-10-CM | POA: Diagnosis not present

## 2017-04-22 DIAGNOSIS — M25539 Pain in unspecified wrist: Secondary | ICD-10-CM | POA: Diagnosis not present

## 2017-05-08 DIAGNOSIS — Z91018 Allergy to other foods: Secondary | ICD-10-CM | POA: Diagnosis not present

## 2017-05-14 DIAGNOSIS — L03031 Cellulitis of right toe: Secondary | ICD-10-CM | POA: Diagnosis not present

## 2017-05-14 DIAGNOSIS — M79674 Pain in right toe(s): Secondary | ICD-10-CM | POA: Diagnosis not present

## 2017-05-19 ENCOUNTER — Inpatient Hospital Stay: Admission: RE | Admit: 2017-05-19 | Payer: BLUE CROSS/BLUE SHIELD | Source: Ambulatory Visit

## 2017-05-28 DIAGNOSIS — M79674 Pain in right toe(s): Secondary | ICD-10-CM | POA: Diagnosis not present

## 2017-05-28 DIAGNOSIS — B351 Tinea unguium: Secondary | ICD-10-CM | POA: Diagnosis not present

## 2017-05-28 DIAGNOSIS — M79675 Pain in left toe(s): Secondary | ICD-10-CM | POA: Diagnosis not present

## 2017-06-02 DIAGNOSIS — R079 Chest pain, unspecified: Secondary | ICD-10-CM | POA: Diagnosis not present

## 2017-06-13 DIAGNOSIS — J309 Allergic rhinitis, unspecified: Secondary | ICD-10-CM | POA: Diagnosis not present

## 2017-06-13 DIAGNOSIS — T360X5A Adverse effect of penicillins, initial encounter: Secondary | ICD-10-CM | POA: Diagnosis not present

## 2017-06-13 DIAGNOSIS — T781XXA Other adverse food reactions, not elsewhere classified, initial encounter: Secondary | ICD-10-CM | POA: Diagnosis not present

## 2017-06-17 DIAGNOSIS — Z6832 Body mass index (BMI) 32.0-32.9, adult: Secondary | ICD-10-CM | POA: Diagnosis not present

## 2017-06-17 DIAGNOSIS — E039 Hypothyroidism, unspecified: Secondary | ICD-10-CM | POA: Diagnosis not present

## 2017-06-17 DIAGNOSIS — Z01419 Encounter for gynecological examination (general) (routine) without abnormal findings: Secondary | ICD-10-CM | POA: Diagnosis not present

## 2017-06-25 DIAGNOSIS — B351 Tinea unguium: Secondary | ICD-10-CM | POA: Diagnosis not present

## 2017-06-25 DIAGNOSIS — M79675 Pain in left toe(s): Secondary | ICD-10-CM | POA: Diagnosis not present

## 2017-06-25 DIAGNOSIS — M79674 Pain in right toe(s): Secondary | ICD-10-CM | POA: Diagnosis not present

## 2017-07-01 DIAGNOSIS — M25539 Pain in unspecified wrist: Secondary | ICD-10-CM | POA: Diagnosis not present

## 2017-07-28 DIAGNOSIS — M79675 Pain in left toe(s): Secondary | ICD-10-CM | POA: Diagnosis not present

## 2017-07-28 DIAGNOSIS — M79674 Pain in right toe(s): Secondary | ICD-10-CM | POA: Diagnosis not present

## 2017-07-28 DIAGNOSIS — B351 Tinea unguium: Secondary | ICD-10-CM | POA: Diagnosis not present

## 2017-07-29 DIAGNOSIS — M25539 Pain in unspecified wrist: Secondary | ICD-10-CM | POA: Diagnosis not present

## 2017-08-26 DIAGNOSIS — M25539 Pain in unspecified wrist: Secondary | ICD-10-CM | POA: Diagnosis not present

## 2017-08-26 DIAGNOSIS — Z880 Allergy status to penicillin: Secondary | ICD-10-CM | POA: Diagnosis not present

## 2017-08-26 DIAGNOSIS — T360X5D Adverse effect of penicillins, subsequent encounter: Secondary | ICD-10-CM | POA: Diagnosis not present

## 2017-08-26 DIAGNOSIS — J300 Vasomotor rhinitis: Secondary | ICD-10-CM | POA: Diagnosis not present

## 2017-08-27 DIAGNOSIS — B351 Tinea unguium: Secondary | ICD-10-CM | POA: Diagnosis not present

## 2017-08-27 DIAGNOSIS — M79674 Pain in right toe(s): Secondary | ICD-10-CM | POA: Diagnosis not present

## 2017-08-27 DIAGNOSIS — M79675 Pain in left toe(s): Secondary | ICD-10-CM | POA: Diagnosis not present

## 2017-09-01 ENCOUNTER — Ambulatory Visit: Payer: BLUE CROSS/BLUE SHIELD

## 2017-11-11 DIAGNOSIS — J019 Acute sinusitis, unspecified: Secondary | ICD-10-CM | POA: Diagnosis not present

## 2017-12-23 DIAGNOSIS — E039 Hypothyroidism, unspecified: Secondary | ICD-10-CM | POA: Diagnosis not present

## 2018-03-31 DIAGNOSIS — M25539 Pain in unspecified wrist: Secondary | ICD-10-CM | POA: Diagnosis not present

## 2018-04-14 DIAGNOSIS — M25539 Pain in unspecified wrist: Secondary | ICD-10-CM | POA: Diagnosis not present

## 2018-04-27 ENCOUNTER — Ambulatory Visit
Admission: RE | Admit: 2018-04-27 | Discharge: 2018-04-27 | Disposition: A | Discharge status: Home / Self Care | Payer: BLUE CROSS/BLUE SHIELD | Source: Ambulatory Visit | Attending: Obstetrics and Gynecology | Admitting: Obstetrics and Gynecology

## 2018-04-27 DIAGNOSIS — Z1231 Encounter for screening mammogram for malignant neoplasm of breast: Secondary | ICD-10-CM | POA: Diagnosis not present

## 2018-04-27 DIAGNOSIS — Z139 Encounter for screening, unspecified: Secondary | ICD-10-CM

## 2018-04-29 ENCOUNTER — Other Ambulatory Visit: Payer: Self-pay | Admitting: Obstetrics and Gynecology

## 2018-04-29 DIAGNOSIS — R928 Other abnormal and inconclusive findings on diagnostic imaging of breast: Secondary | ICD-10-CM

## 2018-05-05 ENCOUNTER — Ambulatory Visit
Admission: RE | Admit: 2018-05-05 | Discharge: 2018-05-05 | Disposition: A | Discharge status: Home / Self Care | Payer: BLUE CROSS/BLUE SHIELD | Source: Ambulatory Visit | Attending: Obstetrics and Gynecology | Admitting: Obstetrics and Gynecology

## 2018-05-05 ENCOUNTER — Other Ambulatory Visit: Payer: Self-pay | Admitting: Obstetrics and Gynecology

## 2018-05-05 DIAGNOSIS — D242 Benign neoplasm of left breast: Secondary | ICD-10-CM | POA: Diagnosis not present

## 2018-05-05 DIAGNOSIS — R921 Mammographic calcification found on diagnostic imaging of breast: Secondary | ICD-10-CM

## 2018-05-05 DIAGNOSIS — M25539 Pain in unspecified wrist: Secondary | ICD-10-CM | POA: Diagnosis not present

## 2018-05-05 DIAGNOSIS — R2232 Localized swelling, mass and lump, left upper limb: Secondary | ICD-10-CM

## 2018-05-05 DIAGNOSIS — R928 Other abnormal and inconclusive findings on diagnostic imaging of breast: Secondary | ICD-10-CM

## 2018-05-05 DIAGNOSIS — R920 Mammographic microcalcification found on diagnostic imaging of breast: Secondary | ICD-10-CM | POA: Diagnosis not present

## 2018-05-11 ENCOUNTER — Ambulatory Visit
Admission: RE | Admit: 2018-05-11 | Discharge: 2018-05-11 | Disposition: A | Discharge status: Home / Self Care | Payer: BLUE CROSS/BLUE SHIELD | Source: Ambulatory Visit | Attending: Obstetrics and Gynecology | Admitting: Obstetrics and Gynecology

## 2018-05-11 DIAGNOSIS — R2232 Localized swelling, mass and lump, left upper limb: Secondary | ICD-10-CM

## 2018-05-11 DIAGNOSIS — R928 Other abnormal and inconclusive findings on diagnostic imaging of breast: Secondary | ICD-10-CM | POA: Diagnosis not present

## 2018-05-11 DIAGNOSIS — N6489 Other specified disorders of breast: Secondary | ICD-10-CM | POA: Diagnosis not present

## 2018-05-12 ENCOUNTER — Other Ambulatory Visit: Payer: Self-pay | Admitting: General Surgery

## 2018-05-12 DIAGNOSIS — D242 Benign neoplasm of left breast: Secondary | ICD-10-CM | POA: Diagnosis not present

## 2018-05-12 DIAGNOSIS — M25539 Pain in unspecified wrist: Secondary | ICD-10-CM | POA: Diagnosis not present

## 2018-05-12 DIAGNOSIS — Z90710 Acquired absence of both cervix and uterus: Secondary | ICD-10-CM | POA: Diagnosis not present

## 2018-05-12 DIAGNOSIS — E039 Hypothyroidism, unspecified: Secondary | ICD-10-CM | POA: Diagnosis not present

## 2018-05-13 ENCOUNTER — Other Ambulatory Visit: Payer: Self-pay | Admitting: General Surgery

## 2018-05-13 DIAGNOSIS — D242 Benign neoplasm of left breast: Secondary | ICD-10-CM

## 2018-05-15 ENCOUNTER — Other Ambulatory Visit: Payer: Self-pay

## 2018-05-15 ENCOUNTER — Encounter (HOSPITAL_COMMUNITY): Payer: Self-pay

## 2018-05-15 ENCOUNTER — Ambulatory Visit
Admission: RE | Admit: 2018-05-15 | Discharge: 2018-05-15 | Disposition: A | Discharge status: Home / Self Care | Payer: BLUE CROSS/BLUE SHIELD | Source: Ambulatory Visit | Attending: General Surgery | Admitting: General Surgery

## 2018-05-15 ENCOUNTER — Encounter (HOSPITAL_COMMUNITY)
Admission: RE | Admit: 2018-05-15 | Discharge: 2018-05-15 | Disposition: A | Discharge status: Home / Self Care | Payer: BLUE CROSS/BLUE SHIELD | Source: Ambulatory Visit | Attending: General Surgery | Admitting: General Surgery

## 2018-05-15 DIAGNOSIS — D242 Benign neoplasm of left breast: Secondary | ICD-10-CM | POA: Diagnosis not present

## 2018-05-15 DIAGNOSIS — Z01812 Encounter for preprocedural laboratory examination: Secondary | ICD-10-CM | POA: Insufficient documentation

## 2018-05-15 HISTORY — DX: Other specified postprocedural states: R11.2

## 2018-05-15 HISTORY — DX: Anemia, unspecified: D64.9

## 2018-05-15 HISTORY — DX: Other specified postprocedural states: Z98.890

## 2018-05-15 HISTORY — DX: Unspecified osteoarthritis, unspecified site: M19.90

## 2018-05-15 LAB — CBC WITH DIFFERENTIAL/PLATELET
Abs Immature Granulocytes: 0.08 10*3/uL — ABNORMAL HIGH (ref 0.00–0.07)
Basophils Absolute: 0 10*3/uL (ref 0.0–0.1)
Basophils Relative: 0 %
Eosinophils Absolute: 0.1 10*3/uL (ref 0.0–0.5)
Eosinophils Relative: 1 %
HCT: 41.9 % (ref 36.0–46.0)
Hemoglobin: 13.4 g/dL (ref 12.0–15.0)
Immature Granulocytes: 1 %
LYMPHS ABS: 2.6 10*3/uL (ref 0.7–4.0)
Lymphocytes Relative: 29 %
MCH: 27.9 pg (ref 26.0–34.0)
MCHC: 32 g/dL (ref 30.0–36.0)
MCV: 87.3 fL (ref 80.0–100.0)
MONOS PCT: 19 %
Monocytes Absolute: 1.8 10*3/uL — ABNORMAL HIGH (ref 0.1–1.0)
Neutro Abs: 4.5 10*3/uL (ref 1.7–7.7)
Neutrophils Relative %: 50 %
Platelets: 282 10*3/uL (ref 150–400)
RBC: 4.8 MIL/uL (ref 3.87–5.11)
RDW: 14.9 % (ref 11.5–15.5)
WBC: 9 10*3/uL (ref 4.0–10.5)
nRBC: 0 % (ref 0.0–0.2)

## 2018-05-15 LAB — COMPREHENSIVE METABOLIC PANEL
ALK PHOS: 58 U/L (ref 38–126)
ALT: 23 U/L (ref 0–44)
AST: 18 U/L (ref 15–41)
Albumin: 4.1 g/dL (ref 3.5–5.0)
Anion gap: 8 (ref 5–15)
BUN: 14 mg/dL (ref 8–23)
CO2: 25 mmol/L (ref 22–32)
Calcium: 9.4 mg/dL (ref 8.9–10.3)
Chloride: 103 mmol/L (ref 98–111)
Creatinine, Ser: 0.6 mg/dL (ref 0.44–1.00)
GFR calc Af Amer: >60 mL/min (ref >60)
GFR calc non Af Amer: >60 mL/min (ref >60)
Glucose, Bld: 92 mg/dL (ref 70–99)
Potassium: 3.8 mmol/L (ref 3.5–5.1)
Sodium: 136 mmol/L (ref 135–145)
Total Bilirubin: 0.3 mg/dL (ref 0.3–1.2)
Total Protein: 6.4 g/dL — ABNORMAL LOW (ref 6.5–8.1)

## 2018-05-15 NOTE — Pre-Procedure Instructions (Signed)
Teresa Floyd  05/15/2018    Your procedure is scheduled on Tuesday, May 19, 2018 at 1:00 PM.   Report to [facility] Entrance "A" Admitting Office at 11:00 AM.   Call this number if you have problems the morning of surgery: [phone]   Questions prior to day of surgery, please call [phone] between 8 & 4 PM.   Remember:  Do not eat food after midnight Monday, 05/18/18  You may drink clear liquids until 10:00AM.  Clear liquids allowed are: Water, Juice (non-pulp), Carbonated beverages, Clear tea, Black coffee, plain Jello, plain Popsicles, Gatorade    Take these medicines the morning of surgery with A SIP OF WATER: Levothyroxine (Synthroid)  Stop NSAIDS (Aleve, Ibuprofen, etc) and Herbal medications as of today. Do not use Aspirin products (Goody's, BC Powders, etc) prior to surgery.    Do not wear jewelry, make-up or nail polish.  Do not wear lotions, powders, perfumes or deodorant.  Do not shave 48 hours prior to surgery.    Do not bring valuables to the hospital.  [facility] is not responsible for any belongings or valuables.  Contacts, dentures or bridgework may not be worn into surgery.  Leave your suitcase in the car.  After surgery it may be brought to your room.  For patients admitted to the hospital, discharge time will be determined by your treatment team.  Patients discharged the day of surgery will not be allowed to drive home.   [place] - Preparing for Surgery  Before surgery, you can play an important role.  Because skin is not sterile, your skin needs to be as free of germs as possible.  You can reduce the number of germs on you skin by washing with CHG (chlorahexidine gluconate) soap before surgery.  CHG is an antiseptic cleaner which kills germs and bonds with the skin to continue killing germs even after washing.  Oral Hygiene is also important in reducing the risk of infection.  Remember to brush your teeth with your regular toothpaste the  morning of surgery.  Please DO NOT use if you have an allergy to CHG or antibacterial soaps.  If your skin becomes reddened/irritated stop using the CHG and inform your nurse when you arrive at Short Stay.  Do not shave (including legs and underarms) for at least 48 hours prior to the first CHG shower.  You may shave your face.  Please follow these instructions carefully:   1.  Shower with CHG Soap the night before surgery and the morning of Surgery.  2.  If you choose to wash your hair, wash your hair first as usual with your normal shampoo.  3.  After you shampoo, rinse your hair and body thoroughly to remove the shampoo. 4.  Use CHG as you would any other liquid soap.  You can apply chg directly to the skin and wash gently with a      scrungie or washcloth.           5.  Apply the CHG Soap to your body ONLY FROM THE NECK DOWN.   Do not use on open wounds or open sores. Avoid contact with your eyes, ears, mouth and genitals (private parts).  Wash genitals (private parts) with your normal soap.  6.  Wash thoroughly, paying special attention to the area where your surgery will be performed.  7.  Thoroughly rinse your body with warm water from the neck down.  8.  DO NOT shower/wash with  your normal soap after using and rinsing off the CHG Soap.  9.  Pat yourself dry with a clean towel.            10.  Wear clean pajamas.            11.  Place clean sheets on your bed the night of your first shower and do not sleep with pets.  Day of Surgery  Shower as above. Do not apply any lotions/deodorants the morning of surgery.   Please wear clean clothes to the hospital. Remember to brush your teeth with toothpaste.   Please read over the fact sheets that you were given.

## 2018-05-15 NOTE — Progress Notes (Signed)
PCP - Dr. Milagros Evener Cardiologist - N/a  Chest x-ray - N/a EKG - N/a Stress Test - denies ECHO - denies Cardiac Cath - denies  Sleep Study - n/a CPAP -   Fasting Blood Sugar - n/a Checks Blood Sugar _____ times a day  Blood Thinner Instructions: n/a Aspirin Instructions: n/a  Anesthesia review: no  Patient denies shortness of breath, fever, cough and chest pain at PAT appointment   Patient verbalized understanding of instructions that were given to them at the PAT appointment. Patient was also instructed that they will need to review over the PAT instructions again at home before surgery.

## 2018-05-17 NOTE — H&P (Signed)
Lenard Forth Location: [facility] Patient #: [id number] DOB: 01-03-1956 Married / Language: English / Race: White Female        History of Present Illness  The patient is a 63 year old female who presents with a breast mass. This is a 63 year old female, referred by Dr. Louretta Shorten for surgical management of intraductal papilloma left breast, lower outer quadrant. [name] is her PCP. Referral initiated by Dr. Lovey Newcomer at the [place].     She has no prior history of breast problems. Recent screening mammograms and ultrasound show a 2.3 cm area of calcifications in the lower outer quadrant of the left breast. Ultrasound of the left axilla was negative. Image guided biopsy of the left breast distortion and calcification showed intraductal papilloma.     Past history reveals she is on hormone replacement therapy. Hypothyroidism. C-section 2. Vaginal hysterectomy and possibly had ovaries removed at the same time. Family history is negative for breast ovarian pancreatic or prostate cancer Social history she is married with 2 daughters. 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 Her home is in his abdomen that she goes there on the weekends. Her husband works in [place]. Denies tobacco. Alcohol occasionally      She is strongly motivated for the recommended excision of her papilloma. This is very reasonable as this is still one of her high risk lesions. She'll be scheduled for left breast lumpectomy with radioactive seed localization. I discussed indications, details, techniques, and risks of the surgery with her in detail. She is aware of the risk of bleeding, infection, cosmetic deformity, chronic pain, reoperation if this is cancer. She understands all these risks. All of her questions are answered. She agrees with this plan.     Past Surgical History  Cesarean Section - Multiple   Colon Polyp Removal - Colonoscopy  Hysterectomy (not due to cancer) - Complete   Diagnostic Studies History  Colonoscopy  1-5 years ago Mammogram  within last year Pap Smear  1-5 years ago  Allergies  No Known Drug Allergies  Allergies Reconciled   Medication History  FLUoxetine HCl (40MG  Capsule, Oral) Active. Probiotic (Oral) Active. Vitamin K2 (Oral) Specific strength unknown - Active. Estradiol (0.05MG /24HR Patch TW, Transdermal) Active. Levothyroxine Sodium (125MCG Tablet, Oral) Active. valACYclovir HCl (1GM Tablet, Oral) Active. Medications Reconciled  Social History  Alcohol use  Occasional alcohol use. Caffeine use  Coffee. No drug use  Tobacco use  Never smoker.  Family History  Alcohol Abuse  Brother, Father, Mother. Arthritis  Mother. Cancer  Father. Cerebrovascular Accident  Mother. Heart Disease  Mother. Heart disease in female family member before age 38  Hypertension  Mother. Thyroid problems  Mother.  Pregnancy / Birth History  Age at menarche  34 years. Age of menopause  24-55 Gravida  3 Irregular periods  Length (months) of breastfeeding  12-24 Maternal age  96-25 Para  2  Other Problems  Arthritis  Thyroid Disease     Review of Systems   Appetite Loss, Chills, Fatigue, Fever, Night Sweats, Weight Gain and Weight Loss. Skin Not Present- Change in Wart/Mole, Dryness, Hives, Jaundice, New Lesions, Non-Healing Wounds, Rash and Ulcer. HEENT Not Present- Earache, Hearing Loss, Hoarseness, Nose Bleed, Oral Ulcers, Ringing in the Ears, Seasonal Allergies, Sinus Pain, Sore Throat, Visual Disturbances, Wears glasses/contact lenses and Yellow Eyes. Respiratory Present- Snoring. Not Present- Bloody sputum, Chronic Cough, Difficulty Breathing and Wheezing.  Breast Present- Breast Mass. Not Present- Breast Pain, Nipple Discharge and Skin Changes. Cardiovascular Not Present- Chest Pain, Difficulty Breathing Lying Down,  Leg Cramps, Palpitations, Rapid Heart Rate, Shortness of Breath and Swelling of Extremities. Gastrointestinal Not Present- Abdominal Pain, Bloating, Bloody Stool, Change in Bowel Habits, Chronic diarrhea, Constipation, Difficulty Swallowing, Excessive gas, Gets full quickly at meals, Hemorrhoids, Indigestion, Nausea, Rectal Pain and Vomiting. Female Genitourinary Not Present- Frequency, Nocturia, Painful Urination, Pelvic Pain and Urgency. Musculoskeletal Present- Joint Pain. Not Present- Back Pain, Joint Stiffness, Muscle Pain, Muscle Weakness and Swelling of Extremities. Neurological Not Present- Decreased Memory, Fainting, Headaches, Numbness, Seizures, Tingling, Tremor, Trouble walking and Weakness. Psychiatric Not Present- Anxiety, Bipolar, Change in Sleep Pattern, Depression, Fearful and Frequent crying. Endocrine Not Present- Cold Intolerance, Excessive Hunger, Hair Changes, Heat Intolerance, Hot flashes and New Diabetes. Hematology Not Present- Blood Thinners, Easy Bruising, Excessive bleeding, Gland problems, HIV and Persistent Infections.  Vitals  Weight: 187 lb Height: 63in Body Surface Area: 1.88 m Body Mass Index: 33.13 kg/m  Temp.: 6F(Temporal)  Pulse: 76 (Regular)  P.OX: 97% (Room air) BP: 130/78 (Sitting, Left Arm, Standard)       Physical Exam  General Mental Status-Alert. General Appearance-Consistent with stated age. Hydration-Well hydrated. Voice-Normal.  Head and Neck Head-normocephalic, atraumatic with no lesions or palpable masses. Trachea-midline. Thyroid Gland Characteristics - normal size and consistency.  Eye Eyeball - Bilateral-Extraocular movements intact. Sclera/Conjunctiva - Bilateral-No scleral icterus.  Chest and Lung Exam Chest and lung exam reveals -quiet, even and easy respiratory effort with no use of accessory muscles and on auscultation, normal breath sounds, no adventitious sounds and normal vocal  resonance. Inspection Chest Wall - Normal. Back - normal.  Breast Note: Breasts are quite large. Needle biopsy site left breast very low outer quadrant. Just above inframammary crease. No hematoma. No mass in either breast. No axillary adenopathy.   Cardiovascular Cardiovascular examination reveals -normal heart sounds, regular rate and rhythm with no murmurs and normal pedal pulses bilaterally.  Abdomen Inspection Inspection of the abdomen reveals - No Hernias. Skin - Scar - Note: Pfannenstiel incision well healed. Palpation/Percussion Palpation and Percussion of the abdomen reveal - Soft, Non Tender, No Rebound tenderness, No Rigidity (guarding) and No hepatosplenomegaly. Auscultation Auscultation of the abdomen reveals - Bowel sounds normal.  Neurologic Neurologic evaluation reveals -alert and oriented x 3 with no impairment of recent or remote memory. Mental Status-Normal.  Musculoskeletal Normal Exam - Left-Upper Extremity Strength Normal and Lower Extremity Strength Normal. Normal Exam - Right-Upper Extremity Strength Normal and Lower Extremity Strength Normal.  Lymphatic Head & Neck  General Head & Neck Lymphatics: Bilateral - Description - Normal. Axillary  General Axillary Region: Bilateral - Description - Normal. Tenderness - Non Tender. Femoral & Inguinal  Generalized Femoral & Inguinal Lymphatics: Bilateral - Description - Normal. Tenderness - Non Tender.    Assessment & Plan  INTRADUCTAL PAPILLOMA OF BREAST, LEFT (D24.2)   Your recent imaging studies and biopsy showed an intraductal papilloma of the left breast, lower outer quadrant The area of distortion is 2.3 cm in size Ultrasound of the left axilla shows normal lymph nodes  Most likely this is benign, but there is at least a 5% chance that this could be upgraded to cancer once it is completely excised Excision is recommended You stated you fully agree and would like to go ahead with this  as soon as possible  you'll be scheduled for left breast lumpectomy with radioactive seed localization I discussed the indications, techniques,  and risk of the surgery in detail  HYPOTHYROIDISM, ADULT (E03.9) HISTORY OF VAGINAL HYSTERECTOMY (Z90.710) HISTORY OF C-SECTION (R04.136)    Edsel Petrin. Dalbert Batman, M.D., [facility] General and Minimally invasive Surgery Breast and Colorectal Surgery Office:   [phone] Pager:   [phone]

## 2018-05-19 ENCOUNTER — Ambulatory Visit (HOSPITAL_COMMUNITY): Payer: BLUE CROSS/BLUE SHIELD | Admitting: Certified Registered"

## 2018-05-19 ENCOUNTER — Ambulatory Visit
Admission: RE | Admit: 2018-05-19 | Discharge: 2018-05-19 | Disposition: A | Discharge status: Home / Self Care | Payer: BLUE CROSS/BLUE SHIELD | Source: Ambulatory Visit | Attending: General Surgery | Admitting: General Surgery

## 2018-05-19 ENCOUNTER — Encounter (HOSPITAL_COMMUNITY): Payer: Self-pay | Admitting: *Deleted

## 2018-05-19 ENCOUNTER — Encounter (HOSPITAL_COMMUNITY)
Admission: RE | Disposition: A | Discharge status: Home / Self Care | Payer: Self-pay | Source: Home / Self Care | Attending: General Surgery

## 2018-05-19 ENCOUNTER — Ambulatory Visit (HOSPITAL_COMMUNITY)
Admission: RE | Admit: 2018-05-19 | Discharge: 2018-05-19 | Disposition: A | Discharge status: Home / Self Care | Payer: BLUE CROSS/BLUE SHIELD | Attending: General Surgery | Admitting: General Surgery

## 2018-05-19 DIAGNOSIS — Z823 Family history of stroke: Secondary | ICD-10-CM | POA: Insufficient documentation

## 2018-05-19 DIAGNOSIS — E669 Obesity, unspecified: Secondary | ICD-10-CM | POA: Insufficient documentation

## 2018-05-19 DIAGNOSIS — D242 Benign neoplasm of left breast: Secondary | ICD-10-CM | POA: Diagnosis not present

## 2018-05-19 DIAGNOSIS — Z86010 Personal history of colonic polyps: Secondary | ICD-10-CM | POA: Insufficient documentation

## 2018-05-19 DIAGNOSIS — Z8349 Family history of other endocrine, nutritional and metabolic diseases: Secondary | ICD-10-CM | POA: Insufficient documentation

## 2018-05-19 DIAGNOSIS — Z79890 Hormone replacement therapy (postmenopausal): Secondary | ICD-10-CM | POA: Insufficient documentation

## 2018-05-19 DIAGNOSIS — Z90710 Acquired absence of both cervix and uterus: Secondary | ICD-10-CM | POA: Diagnosis not present

## 2018-05-19 DIAGNOSIS — Z79899 Other long term (current) drug therapy: Secondary | ICD-10-CM | POA: Diagnosis not present

## 2018-05-19 DIAGNOSIS — Z8261 Family history of arthritis: Secondary | ICD-10-CM | POA: Diagnosis not present

## 2018-05-19 DIAGNOSIS — M1990 Unspecified osteoarthritis, unspecified site: Secondary | ICD-10-CM | POA: Insufficient documentation

## 2018-05-19 DIAGNOSIS — Z811 Family history of alcohol abuse and dependence: Secondary | ICD-10-CM | POA: Insufficient documentation

## 2018-05-19 DIAGNOSIS — D0512 Intraductal carcinoma in situ of left breast: Secondary | ICD-10-CM | POA: Diagnosis not present

## 2018-05-19 DIAGNOSIS — F329 Major depressive disorder, single episode, unspecified: Secondary | ICD-10-CM | POA: Insufficient documentation

## 2018-05-19 DIAGNOSIS — Z809 Family history of malignant neoplasm, unspecified: Secondary | ICD-10-CM | POA: Diagnosis not present

## 2018-05-19 DIAGNOSIS — Z8249 Family history of ischemic heart disease and other diseases of the circulatory system: Secondary | ICD-10-CM | POA: Insufficient documentation

## 2018-05-19 DIAGNOSIS — E039 Hypothyroidism, unspecified: Secondary | ICD-10-CM | POA: Diagnosis not present

## 2018-05-19 DIAGNOSIS — Z6832 Body mass index (BMI) 32.0-32.9, adult: Secondary | ICD-10-CM | POA: Diagnosis not present

## 2018-05-19 DIAGNOSIS — R928 Other abnormal and inconclusive findings on diagnostic imaging of breast: Secondary | ICD-10-CM | POA: Diagnosis not present

## 2018-05-19 HISTORY — DX: Benign neoplasm of left breast: D24.2

## 2018-05-19 HISTORY — PX: BREAST LUMPECTOMY WITH RADIOACTIVE SEED LOCALIZATION: SHX6424

## 2018-05-19 SURGERY — BREAST LUMPECTOMY WITH RADIOACTIVE SEED LOCALIZATION
Anesthesia: General | Site: Breast | Laterality: Left

## 2018-05-19 MED ORDER — FENTANYL CITRATE (PF) 100 MCG/2ML IJ SOLN: 25.0000 ug | INTRAMUSCULAR | Status: DC | PRN

## 2018-05-19 MED ORDER — SODIUM CHLORIDE 0.9 % IV SOLN
INTRAVENOUS | Status: DC | PRN
  Administered 2018-05-19: 80 ug/min via INTRAVENOUS

## 2018-05-19 MED ORDER — MIDAZOLAM HCL 2 MG/2ML IJ SOLN
INTRAMUSCULAR | Status: AC
  Filled 2018-05-19: qty 2

## 2018-05-19 MED ORDER — HYDROCODONE-ACETAMINOPHEN 5-325 MG PO TABS: 1.0000 | ORAL_TABLET | Freq: Four times a day (QID) | ORAL | 0 refills | Status: AC | PRN

## 2018-05-19 MED ORDER — LIDOCAINE 2% (20 MG/ML) 5 ML SYRINGE
INTRAMUSCULAR | Status: AC
  Filled 2018-05-19: qty 5

## 2018-05-19 MED ORDER — DEXAMETHASONE SODIUM PHOSPHATE 4 MG/ML IJ SOLN
INTRAMUSCULAR | Status: DC | PRN
  Administered 2018-05-19: 5 mg via INTRAVENOUS

## 2018-05-19 MED ORDER — ACETAMINOPHEN 500 MG PO TABS
1000.0000 mg | ORAL_TABLET | ORAL | Status: AC
  Administered 2018-05-19: 1000 mg via ORAL
  Filled 2018-05-19: qty 2

## 2018-05-19 MED ORDER — 0.9 % SODIUM CHLORIDE (POUR BTL) OPTIME
TOPICAL | Status: DC | PRN
  Administered 2018-05-19: 1000 mL

## 2018-05-19 MED ORDER — PROPOFOL 10 MG/ML IV BOLUS
INTRAVENOUS | Status: DC | PRN
  Administered 2018-05-19: 200 mg via INTRAVENOUS

## 2018-05-19 MED ORDER — LIDOCAINE HCL (CARDIAC) PF 100 MG/5ML IV SOSY
PREFILLED_SYRINGE | INTRAVENOUS | Status: DC | PRN
  Administered 2018-05-19: 80 mg via INTRAVENOUS

## 2018-05-19 MED ORDER — PROMETHAZINE HCL 25 MG/ML IJ SOLN: 6.2500 mg | INTRAMUSCULAR | Status: DC | PRN

## 2018-05-19 MED ORDER — CELECOXIB 200 MG PO CAPS
200.0000 mg | ORAL_CAPSULE | ORAL | Status: AC
  Administered 2018-05-19: 200 mg via ORAL
  Filled 2018-05-19: qty 1

## 2018-05-19 MED ORDER — GABAPENTIN 300 MG PO CAPS
300.0000 mg | ORAL_CAPSULE | ORAL | Status: AC
  Administered 2018-05-19: 300 mg via ORAL
  Filled 2018-05-19: qty 1

## 2018-05-19 MED ORDER — CHLORHEXIDINE GLUCONATE CLOTH 2 % EX PADS: 6.0000 | MEDICATED_PAD | Freq: Once | CUTANEOUS | Status: DC

## 2018-05-19 MED ORDER — HYDROCODONE-ACETAMINOPHEN 5-325 MG PO TABS
1.0000 | ORAL_TABLET | Freq: Four times a day (QID) | ORAL | Status: DC | PRN
  Administered 2018-05-19: 2 via ORAL

## 2018-05-19 MED ORDER — PROPOFOL 500 MG/50ML IV EMUL
INTRAVENOUS | Status: DC | PRN
  Administered 2018-05-19: 150 ug/kg/min via INTRAVENOUS

## 2018-05-19 MED ORDER — LACTATED RINGERS IV SOLN
INTRAVENOUS | Status: DC
  Administered 2018-05-19: 13:00:00 via INTRAVENOUS

## 2018-05-19 MED ORDER — MIDAZOLAM HCL 5 MG/5ML IJ SOLN
INTRAMUSCULAR | Status: DC | PRN
  Administered 2018-05-19: 2 mg via INTRAVENOUS

## 2018-05-19 MED ORDER — PROPOFOL 10 MG/ML IV BOLUS
INTRAVENOUS | Status: AC
  Filled 2018-05-19: qty 20

## 2018-05-19 MED ORDER — CEFAZOLIN SODIUM-DEXTROSE 2-4 GM/100ML-% IV SOLN
2.0000 g | INTRAVENOUS | Status: AC
  Administered 2018-05-19: 2 g via INTRAVENOUS
  Filled 2018-05-19: qty 100

## 2018-05-19 MED ORDER — BUPIVACAINE HCL (PF) 0.25 % IJ SOLN
INTRAMUSCULAR | Status: AC
  Filled 2018-05-19: qty 30

## 2018-05-19 MED ORDER — FENTANYL CITRATE (PF) 100 MCG/2ML IJ SOLN
INTRAMUSCULAR | Status: DC | PRN
  Administered 2018-05-19: 50 ug via INTRAVENOUS

## 2018-05-19 MED ORDER — HYDROCODONE-ACETAMINOPHEN 5-325 MG PO TABS
ORAL_TABLET | ORAL | Status: AC
  Filled 2018-05-19: qty 2

## 2018-05-19 MED ORDER — FENTANYL CITRATE (PF) 250 MCG/5ML IJ SOLN
INTRAMUSCULAR | Status: AC
  Filled 2018-05-19: qty 5

## 2018-05-19 MED ORDER — MEPERIDINE HCL 50 MG/ML IJ SOLN: 6.2500 mg | INTRAMUSCULAR | Status: DC | PRN

## 2018-05-19 MED ORDER — MIDAZOLAM HCL 2 MG/2ML IJ SOLN: 0.5000 mg | Freq: Once | INTRAMUSCULAR | Status: DC | PRN

## 2018-05-19 MED ORDER — PHENYLEPHRINE HCL 10 MG/ML IJ SOLN
INTRAMUSCULAR | Status: DC | PRN
  Administered 2018-05-19: 120 ug via INTRAVENOUS

## 2018-05-19 MED ORDER — BUPIVACAINE-EPINEPHRINE 0.25% -1:200000 IJ SOLN
INTRAMUSCULAR | Status: DC | PRN
  Administered 2018-05-19: 10 mL

## 2018-05-19 MED ORDER — SCOPOLAMINE 1 MG/3DAYS TD PT72
1.0000 | MEDICATED_PATCH | Freq: Once | TRANSDERMAL | Status: DC
  Administered 2018-05-19: 1.5 mg via TRANSDERMAL
  Filled 2018-05-19: qty 1

## 2018-05-19 MED ORDER — CELECOXIB 200 MG PO CAPS
ORAL_CAPSULE | ORAL | Status: AC
  Filled 2018-05-19: qty 1

## 2018-05-19 SURGICAL SUPPLY — 43 items
ADH SKN CLS APL DERMABOND .7 (GAUZE/BANDAGES/DRESSINGS) ×1
APPLIER CLIP 9.375 MED OPEN (MISCELLANEOUS) ×2
APR CLP MED 9.3 20 MLT OPN (MISCELLANEOUS) ×1
BINDER BREAST LRG (GAUZE/BANDAGES/DRESSINGS) IMPLANT
BINDER BREAST XLRG (GAUZE/BANDAGES/DRESSINGS) IMPLANT
BINDER BREAST XXLRG (GAUZE/BANDAGES/DRESSINGS) ×1 IMPLANT
CANISTER SUCT 3000ML PPV (MISCELLANEOUS) ×2 IMPLANT
CHLORAPREP W/TINT 26ML (MISCELLANEOUS) ×2 IMPLANT
CLIP APPLIE 9.375 MED OPEN (MISCELLANEOUS) ×1 IMPLANT
COVER PROBE W GEL 5X96 (DRAPES) ×2 IMPLANT
COVER SURGICAL LIGHT HANDLE (MISCELLANEOUS) ×2 IMPLANT
COVER WAND RF STERILE (DRAPES) ×2 IMPLANT
DERMABOND ADVANCED (GAUZE/BANDAGES/DRESSINGS) ×1
DERMABOND ADVANCED .7 DNX12 (GAUZE/BANDAGES/DRESSINGS) ×1 IMPLANT
DEVICE DUBIN SPECIMEN MAMMOGRA (MISCELLANEOUS) ×2 IMPLANT
DRAPE CHEST BREAST 15X10 FENES (DRAPES) ×2 IMPLANT
DRSG PAD ABDOMINAL 8X10 ST (GAUZE/BANDAGES/DRESSINGS) ×2 IMPLANT
ELECT CAUTERY BLADE 6.4 (BLADE) ×2 IMPLANT
ELECT REM PT RETURN 9FT ADLT (ELECTROSURGICAL) ×2
ELECTRODE REM PT RTRN 9FT ADLT (ELECTROSURGICAL) ×1 IMPLANT
GAUZE SPONGE 4X4 12PLY STRL (GAUZE/BANDAGES/DRESSINGS) ×1 IMPLANT
GAUZE SPONGE 4X4 12PLY STRL LF (GAUZE/BANDAGES/DRESSINGS) ×2 IMPLANT
GLOVE EUDERMIC 7 POWDERFREE (GLOVE) ×4 IMPLANT
GOWN STRL REUS W/ TWL LRG LVL3 (GOWN DISPOSABLE) ×1 IMPLANT
GOWN STRL REUS W/ TWL XL LVL3 (GOWN DISPOSABLE) ×1 IMPLANT
GOWN STRL REUS W/TWL LRG LVL3 (GOWN DISPOSABLE) ×2
GOWN STRL REUS W/TWL XL LVL3 (GOWN DISPOSABLE) ×2
ILLUMINATOR WAVEGUIDE N/F (MISCELLANEOUS) IMPLANT
KIT BASIN OR (CUSTOM PROCEDURE TRAY) ×2 IMPLANT
KIT MARKER MARGIN INK (KITS) ×2 IMPLANT
LIGHT WAVEGUIDE WIDE FLAT (MISCELLANEOUS) IMPLANT
NDL HYPO 25GX1X1/2 BEV (NEEDLE) ×1 IMPLANT
NEEDLE HYPO 25GX1X1/2 BEV (NEEDLE) ×2 IMPLANT
NS IRRIG 1000ML POUR BTL (IV SOLUTION) ×2 IMPLANT
PACK GENERAL/GYN (CUSTOM PROCEDURE TRAY) ×2 IMPLANT
PAD ABD 8X10 STRL (GAUZE/BANDAGES/DRESSINGS) ×1 IMPLANT
SPONGE LAP 4X18 RFD (DISPOSABLE) ×2 IMPLANT
SUT MNCRL AB 4-0 PS2 18 (SUTURE) ×2 IMPLANT
SUT SILK 2 0 SH (SUTURE) ×2 IMPLANT
SUT VIC AB 3-0 SH 18 (SUTURE) ×2 IMPLANT
SYR CONTROL 10ML LL (SYRINGE) ×2 IMPLANT
TOWEL OR 17X24 6PK STRL BLUE (TOWEL DISPOSABLE) ×2 IMPLANT
TOWEL OR 17X26 10 PK STRL BLUE (TOWEL DISPOSABLE) ×2 IMPLANT

## 2018-05-19 NOTE — Op Note (Signed)
Patient Name:           Teresa Floyd   Date of Surgery:        05/19/2018  Pre op Diagnosis:      Intraductal papilloma left breast, lower outer quadrant, near inframammary crease  Post op Diagnosis:    Same  Procedure:                 Left breast lumpectomy with radioactive seed localization                                      Reexcision left breast tissue at 6:00  Surgeon:                     Teresa Floyd, M.D., FACS  Assistant:                      OR staff  Operative Indications:   This is a 63 year old female, referred by Dr. Louretta Floyd for surgical management of intraductal papilloma left breast, lower outer quadrant. Teresa Floyd is her PCP. Referral initiated by Dr. Lovey Floyd at the [place].     She has no prior history of breast problems. Recent screening mammograms and ultrasound show a 2.3 cm area of calcifications in the lower outer quadrant of the left breast. Ultrasound of the left axilla was negative. Image guided biopsy of the left breast distortion and calcification showed intraductal papilloma.      She is strongly motivated for the recommended excision of her papilloma. This is very reasonable as this is still one of her high risk lesions. She'll be scheduled for left breast lumpectomy with radioactive seed localization.    When the radioactive seed was placed the radiologist noted that the seed was 7 mm superior to the original biopsy clip, still very close to one another.  Radiologist recommended a very inferior posterior approach.  Operative Findings:       The radioactive seed was identified above the inframammary crease.  We were able to make a hidden scar technique incision at the inframammary crease.  We took all the breast tissue out from the inframammary crease superiorly and we took the dissection all the way down to the chest wall to be sure that we removed the tissue to be examined.  The specimen mammogram showed the radioactive seed but did not  show the original clip.  I reexcised some adjacent breast tissue and but there was no clip in that and we decided not to resect any further tissue.  I discussed this with Dr. Theda Sers at the [facility] and she felt that we had removed all of the appropriate tissue.  Procedure in Detail:          Following the induction of general LMA anesthesia the patient's left breast and chest wall were prepped and draped in a sterile fashion.  Surgical timeout was performed.  Intravenous antibiotics were given.  0.25% Marcaine with epinephrine was used as a local infiltration anesthetic.     Using the neoprobe I identified the radioactive signal about 1.5 cm above the inframammary crease in the lower outer left breast.  Incision was made at the inframammary crease.  I first took the dissection straight down to the anterior rectus sheath and then performed a lumpectomy using the neoprobe feeling that I was going to remove all of the  appropriate tissue.  The specimen was removed and marked with silk sutures and a 6 color ink kit.  Specimen mammogram was performed and contained the radioactive seed in the center of the specimen as described above.  Since the marker clip was not seen I took further breast tissue adjacent to the original excision at the 6 o'clock position.  Imaging study showed no clip.  After discussion with radiology we decided no further excision was be indicated.     The wound was irrigated.  Hemostasis was excellent and achieved with electrocautery.  5 metal marker clips were placed in the walls of the lumpectomy cavity.  Lumpectomy cavity was closed down with several layers of interrupted 3-0 Vicryl and the skin closed with a running subcuticular 4-0 Monocryl and Dermabond.  Dry bandages and a breast binder were placed.  The patient tolerated the procedure well and was taken to PACU in stable condition.  EBL 25 cc.  Counts correct.  Complications none.    Addendum: I logged onto the PMP aware website and  reviewed her prescription medication history.      Teresa Floyd, M.D., FACS General and Minimally Invasive Surgery Breast and Colorectal Surgery  05/19/2018 2:25 PM

## 2018-05-19 NOTE — Anesthesia Procedure Notes (Signed)
Procedure Name: LMA Insertion Date/Time: 05/19/2018 1:28 PM Performed by: Oletta Lamas, CRNA Pre-anesthesia Checklist: Patient identified, Emergency Drugs available, Suction available and Patient being monitored Patient Re-evaluated:Patient Re-evaluated prior to induction Oxygen Delivery Method: Circle System Utilized Preoxygenation: Pre-oxygenation with 100% oxygen Induction Type: IV induction Ventilation: Mask ventilation without difficulty LMA: LMA inserted LMA Size: 4.0 Number of attempts: 1 Placement Confirmation: positive ETCO2 Tube secured with: Tape Dental Injury: Teeth and Oropharynx as per pre-operative assessment

## 2018-05-19 NOTE — Anesthesia Preprocedure Evaluation (Addendum)
Anesthesia Evaluation  Patient identified by MRN, date of birth, ID band Patient awake    Reviewed: Allergy & Precautions, NPO status , Patient's Chart, lab work & pertinent test results  History of Anesthesia Complications (+) PONV  Airway Mallampati: I  TM Distance: >3 FB Neck ROM: Full    Dental  (+) Dental Advisory Given   Pulmonary neg pulmonary ROS,    breath sounds clear to auscultation       Cardiovascular negative cardio ROS   Rhythm:Regular Rate:Normal     Neuro/Psych Depression negative neurological ROS     GI/Hepatic negative GI ROS, Neg liver ROS,   Endo/Other  Hypothyroidism   Renal/GU negative Renal ROS     Musculoskeletal  (+) Arthritis ,   Abdominal (+) + obese,   Peds  Hematology negative hematology ROS (+)   Anesthesia Other Findings   Reproductive/Obstetrics                            Anesthesia Physical Anesthesia Plan  ASA: II  Anesthesia Plan: General   Post-op Pain Management:    Induction: Intravenous  PONV Risk Score and Plan: 4 or greater and Scopolamine patch - Pre-op, Dexamethasone and Ondansetron  Airway Management Planned: LMA  Additional Equipment:   Intra-op Plan:   Post-operative Plan:   Informed Consent: I have reviewed the patients History and Physical, chart, labs and discussed the procedure including the risks, benefits and alternatives for the proposed anesthesia with the patient or authorized representative who has indicated his/her understanding and acceptance.     Dental advisory given  Plan Discussed with: CRNA and Surgeon  Anesthesia Plan Comments: (Plan routine monitors, GA- LMA OK)       Anesthesia Quick Evaluation

## 2018-05-19 NOTE — Transfer of Care (Signed)
Immediate Anesthesia Transfer of Care Note  Patient: Teresa Floyd  Procedure(s) Performed: LEFT BREAST LUMPECTOMY WITH RADIOACTIVE SEED LOCALIZATION (Left Breast)  Patient Location: PACU  Anesthesia Type:General  Level of Consciousness: awake  Airway & Oxygen Therapy: Patient Spontanous Breathing  Post-op Assessment: Report given to RN and Post -op Vital signs reviewed and stable  Post vital signs: Reviewed and stable  Last Vitals:  Vitals Value Taken Time  BP 111/64 05/19/2018  2:28 PM  Temp    Pulse 81 05/19/2018  2:29 PM  Resp 14 05/19/2018  2:29 PM  SpO2 95 % 05/19/2018  2:29 PM  Vitals shown include unvalidated device data.  Last Pain:  Vitals:   05/19/18 1058  TempSrc: Oral          Complications: none

## 2018-05-19 NOTE — Interval H&P Note (Signed)
History and Physical Interval Note:  05/19/2018 10:51 AM  Teresa Floyd  has presented today for surgery, with the diagnosis of LEFT INTRADUCTAL PAPILLOMA  The various methods of treatment have been discussed with the patient and family. After consideration of risks, benefits and other options for treatment, the patient has consented to  Procedure(s): LEFT BREAST LUMPECTOMY WITH RADIOACTIVE SEED LOCALIZATION (Left) as a surgical intervention .  The patient's history has been reviewed, patient examined, no change in status, stable for surgery.  I have reviewed the patient's chart and labs.  Questions were answered to the patient's satisfaction.     Teresa Floyd

## 2018-05-19 NOTE — Anesthesia Postprocedure Evaluation (Signed)
Anesthesia Post Note  Patient: Teresa Floyd  Procedure(s) Performed: LEFT BREAST LUMPECTOMY WITH RADIOACTIVE SEED LOCALIZATION (Left Breast)     Patient location during evaluation: PACU Anesthesia Type: General Level of consciousness: awake and alert, oriented and patient cooperative Pain management: pain level controlled Vital Signs Assessment: post-procedure vital signs reviewed and stable Respiratory status: spontaneous breathing, nonlabored ventilation and respiratory function stable Cardiovascular status: blood pressure returned to baseline and stable Postop Assessment: no apparent nausea or vomiting Anesthetic complications: no    Last Vitals:  Vitals:   05/19/18 1445 05/19/18 1500  BP: 127/72 125/80  Pulse: 74 71  Resp: 11 16  Temp:    SpO2: 96% 98%    Last Pain:  Vitals:   05/19/18 1445  TempSrc:   PainSc: 5                  Giordan Fordham,E. Bao Coreas

## 2018-05-19 NOTE — Discharge Instructions (Signed)
[facility] °Office Phone Number [phone] ° °BREAST BIOPSY/ PARTIAL MASTECTOMY: POST OP INSTRUCTIONS ° °Always review your discharge instruction sheet given to you by the facility where your surgery was performed. ° °IF YOU HAVE DISABILITY OR FAMILY LEAVE FORMS, YOU MUST BRING THEM TO THE OFFICE FOR PROCESSING.  DO NOT GIVE THEM TO YOUR DOCTOR. ° °1. A prescription for pain medication may be given to you upon discharge.  Take your pain medication as prescribed, if needed.  If narcotic pain medicine is not needed, then you may take acetaminophen (Tylenol) or ibuprofen (Advil) as needed. °2. Take your usually prescribed medications unless otherwise directed °3. If you need a refill on your pain medication, please contact your pharmacy.  They will contact our office to request authorization.  Prescriptions will not be filled after 5pm or on week-ends. °4. You should eat very light the first 24 hours after surgery, such as soup, crackers, pudding, etc.  Resume your normal diet the day after surgery. °5. Most patients will experience some swelling and bruising in the breast.  Ice packs and a good support bra will help.  Swelling and bruising can take several days to resolve.  °6. It is common to experience some constipation if taking pain medication after surgery.  Increasing fluid intake and taking a stool softener will usually help or prevent this problem from occurring.  A mild laxative (Milk of Magnesia or Miralax) should be taken according to package directions if there are no bowel movements after 48 hours. °7. Unless discharge instructions indicate otherwise, you may remove your bandages 24-48 hours after surgery, and you may shower at that time.  You may have steri-strips (small skin tapes) in place directly over the incision.  These strips should be left on the skin for 7-10 days.  If your surgeon used skin glue on the incision, you may shower in 24 hours.  The glue will flake off over the  next 2-3 weeks.  Any sutures or staples will be removed at the office during your follow-up visit. °8. ACTIVITIES:  You may resume regular daily activities (gradually increasing) beginning the next day.  Wearing a good support bra or sports bra minimizes pain and swelling.  You may have sexual intercourse when it is comfortable. °a. You may drive when you no longer are taking prescription pain medication, you can comfortably wear a seatbelt, and you can safely maneuver your car and apply brakes. °b. RETURN TO WORK:  ______________________________________________________________________________________ °9. You should see your doctor in the office for a follow-up appointment approximately two weeks after your surgery.  Your doctor’s nurse will typically make your follow-up appointment when she calls you with your pathology report.  Expect your pathology report 2-3 business days after your surgery.  You may call to check if you do not hear from us after three days. °10. OTHER INSTRUCTIONS: _______________________________________________________________________________________________ _____________________________________________________________________________________________________________________________________ °_____________________________________________________________________________________________________________________________________ °_____________________________________________________________________________________________________________________________________ ° °WHEN TO CALL YOUR DOCTOR: °1. Fever over 101.0 °2. Nausea and/or vomiting. °3. Extreme swelling or bruising. °4. Continued bleeding from incision. °5. Increased pain, redness, or drainage from the incision. ° °The clinic staff is available to answer your questions during regular business hours.  Please don’t hesitate to call and ask to speak to one of the nurses for clinical concerns.  If you have a medical emergency, go to the nearest  emergency room or call 911.  A surgeon from [facility] is always on call at the hospital. ° °For further questions, please visit [email or web address]  ° ° ° ° ° ° ° ° ° ° °••••••••• ° ° °  Managing Your Pain After Surgery Without Opioids ° ° ° °Thank you for participating in our program to help patients manage their pain after surgery without opioids. This is part of our effort to provide you with the best care possible, without exposing you or your family to the risk that opioids pose. ° °What pain can I expect after surgery? °You can expect to have some pain after surgery. This is normal. The pain is typically worse the day after surgery, and quickly begins to get better. °Many studies have found that many patients are able to manage their pain after surgery with Over-the-Counter (OTC) medications such as Tylenol and Motrin. If you have a condition that does not allow you to take Tylenol or Motrin, notify your surgical team. ° °How will I manage my pain? °The best strategy for controlling your pain after surgery is around the clock pain control with Tylenol (acetaminophen) and Motrin (ibuprofen or Advil). Alternating these medications with each other allows you to maximize your pain control. In addition to Tylenol and Motrin, you can use heating pads or ice packs on your incisions to help reduce your pain. ° °How will I alternate your regular strength over-the-counter pain medication? °You will take a dose of pain medication every three hours. °; Start by taking 650 mg of Tylenol (2 pills of 325 mg) °; 3 hours later take 600 mg of Motrin (3 pills of 200 mg) °; 3 hours after taking the Motrin take 650 mg of Tylenol °; 3 hours after that take 600 mg of Motrin. ° ° °- 1 - ° °See example - if your first dose of Tylenol is at 12:00 PM ° ° °12:00 PM Tylenol 650 mg (2 pills of 325 mg)  °3:00 PM Motrin 600 mg (3 pills of 200 mg)  °6:00 PM Tylenol 650 mg (2 pills of 325 mg)  °9:00 PM Motrin 600 mg (3  pills of 200 mg)  °Continue alternating every 3 hours  ° °We recommend that you follow this schedule around-the-clock for at least 3 days after surgery, or until you feel that it is no longer needed. Use the table on the last page of this handout to keep track of the medications you are taking. °Important: °Do not take more than 3000mg of Tylenol or 3200mg of Motrin in a 24-hour period. °Do not take ibuprofen/Motrin if you have a history of bleeding stomach ulcers, severe kidney disease, &/or actively taking a blood thinner ° °What if I still have pain? °If you have pain that is not controlled with the over-the-counter pain medications (Tylenol and Motrin or Advil) you might have what we call “breakthrough” pain. You will receive a prescription for a small amount of an opioid pain medication such as Oxycodone, Tramadol, or Tylenol with Codeine. Use these opioid pills in the first 24 hours after surgery if you have breakthrough pain. Do not take more than 1 pill every 4-6 hours. ° °If you still have uncontrolled pain after using all opioid pills, don't hesitate to call our staff using the number provided. We will help make sure you are managing your pain in the best way possible, and if necessary, we can provide a prescription for additional pain medication. ° ° °Day 1   ° °Time  °Name of Medication Number of pills taken  °Amount of Acetaminophen  °Pain Level  ° °Comments  °AM PM       °AM PM       °AM PM       °  AM PM       °AM PM       °AM PM       °AM PM       °AM PM       °Total Daily amount of Acetaminophen °Do not take more than  3,000 mg per day    ° ° °Day 2   ° °Time  °Name of Medication Number of pills °taken  °Amount of Acetaminophen  °Pain Level  ° °Comments  °AM PM       °AM PM       °AM PM       °AM PM       °AM PM       °AM PM       °AM PM       °AM PM       °Total Daily amount of Acetaminophen °Do not take more than  3,000 mg per day    ° ° °Day 3   ° °Time  °Name of Medication Number of pills taken    °Amount of Acetaminophen  °Pain Level  ° °Comments  °AM PM       °AM PM       °AM PM       °AM PM       ° ° ° °AM PM       °AM PM       °AM PM       °AM PM       °Total Daily amount of Acetaminophen °Do not take more than  3,000 mg per day    ° ° °Day 4   ° °Time  °Name of Medication Number of pills taken  °Amount of Acetaminophen  °Pain Level  ° °Comments  °AM PM       °AM PM       °AM PM       °AM PM       °AM PM       °AM PM       °AM PM       °AM PM       °Total Daily amount of Acetaminophen °Do not take more than  3,000 mg per day    ° ° °Day 5   ° °Time  °Name of Medication Number °of pills taken  °Amount of Acetaminophen  °Pain Level  ° °Comments  °AM PM       °AM PM       °AM PM       °AM PM       °AM PM       °AM PM       °AM PM       °AM PM       °Total Daily amount of Acetaminophen °Do not take more than  3,000 mg per day    ° ° ° °Day 6   ° °Time  °Name of Medication Number of pills °taken  °Amount of Acetaminophen  °Pain Level  °Comments  °AM PM       °AM PM       °AM PM       °AM PM       °AM PM       °AM PM       °AM PM       °AM PM       °Total Daily amount of Acetaminophen °Do not take more than    3,000 mg per day    ° ° °Day 7   ° °Time  °Name of Medication Number of pills taken  °Amount of Acetaminophen  °Pain Level  ° °Comments  °AM PM       °AM PM       °AM PM       °AM PM       °AM PM       °AM PM       °AM PM       °AM PM       °Total Daily amount of Acetaminophen °Do not take more than  3,000 mg per day    ° ° ° ° °For additional information about how and where to safely dispose of unused opioid °medications - https://www.morepowerfulnc.org ° °Disclaimer: This document contains information and/or instructional materials adapted from [facility] for the typical patient with your condition. It does not replace medical advice from your health care provider because your experience may differ from that of the °typical patient. Talk to your health care provider if you have any questions about  this °document, your condition or your treatment plan. °Adapted from [facility] ° °

## 2018-05-20 ENCOUNTER — Encounter (HOSPITAL_COMMUNITY): Payer: Self-pay | Admitting: General Surgery

## 2018-06-16 NOTE — Progress Notes (Signed)
[facility] CONSULT NOTE  Patient Care Team: Rankins, Bill Salinas, MD as PCP - General (Family Medicine)  CHIEF COMPLAINTS/PURPOSE OF CONSULTATION: Newly diagnosed breast cancer  HISTORY OF PRESENTING ILLNESS:  Teresa Floyd 63 y.o. female is here because of recent diagnosis of DCIS of the left breast. The cancer was detected on a screening mammogram on 04/27/18. A diagnostic mammogram on 05/05/18 showed a 2.3cm group of calcifications in the left breast. A biopsy on 05/05/18 showed intraductal papilloma. An [place] on 05/11/18 showed no abnormalities associated with palpable thickening in the left axilla. She underwent a left lumpectomy on 05/19/18 with Dr. Renelda Loma, for which pathology confirmed the intraductal papilloma and showed intermediate grade DCIS.   She presents to the clinic alone today. She denies/reports a family history of breast cancer.   I reviewed her records extensively and collaborated the history with the patient.  SUMMARY OF ONCOLOGIC HISTORY:   Papilloma of left breast   05/19/2018 Initial Diagnosis    Papilloma of left breast     Ductal carcinoma in situ (DCIS) of left breast   05/05/2018 Initial Diagnosis    Screening mammogram detected left breast calcifications 2.3 x 1.6 x 2.2 cm area.  Biopsy revealed intraductal papilloma with calcifications no malignancy identified.    05/19/2018 Surgery    Left lumpectomy: 0.5 cm DCIS intermediate grade, partially involving intraductal papilloma, margins negative, insufficient material for ER and PR testing, Tis NX stage 0    MEDICAL HISTORY:  Past Medical History:  Diagnosis Date  . Anemia    before hysterectomy  . Arthritis    wrists  . Depression   . Genital herpes   . Hypothyroidism   . Leukocytosis   . Papilloma of left breast 05/19/2018  . PONV (postoperative nausea and vomiting)    after hysterectomy    SURGICAL HISTORY: Past Surgical History:  Procedure Laterality Date  . ABDOMINAL HYSTERECTOMY     . BREAST LUMPECTOMY WITH RADIOACTIVE SEED LOCALIZATION Left 05/19/2018   Procedure: LEFT BREAST LUMPECTOMY WITH RADIOACTIVE SEED LOCALIZATION;  Surgeon: Fanny Skates, MD;  Location: [facility];  Service: General;  Laterality: Left;  . [place]  . COLONOSCOPY      SOCIAL HISTORY: Social History   Socioeconomic History  . Marital status: Married    Spouse name: Not on file  . Number of children: Not on file  . Years of education: Not on file  . Highest education level: Not on file  Occupational History  . Not on file  Social Needs  . Financial resource strain: Not on file  . Food insecurity:    Worry: Not on file    Inability: Not on file  . Transportation needs:    Medical: Not on file    Non-medical: Not on file  Tobacco Use  . Smoking status: Never Smoker  . Smokeless tobacco: Never Used  Substance and Sexual Activity  . Alcohol use: No  . Drug use: No  . Sexual activity: Not on file  Lifestyle  . Physical activity:    Days per week: Not on file    Minutes per session: Not on file  . Stress: Not on file  Relationships  . Social connections:    Talks on phone: Not on file    Gets together: Not on file    Attends religious service: Not on file    Active member of club or organization: Not on file  Attends meetings of clubs or organizations: Not on file    Relationship status: Not on file  . Intimate partner violence:    Fear of current or ex partner: Not on file    Emotionally abused: Not on file    Physically abused: Not on file    Forced sexual activity: Not on file  Other Topics Concern  . Not on file  Social History Narrative  . Not on file    FAMILY HISTORY: No family history on file.  ALLERGIES:  has No Known Allergies.  MEDICATIONS:  Current Outpatient Medications  Medication Sig Dispense Refill  . estradiol (ESTRACE) 0.1 MG/GM vaginal cream Place 1 Applicatorful vaginally once a week.     . estradiol (VIVELLE-DOT) 0.05  MG/24HR Place 1 patch onto the skin 2 (two) times a week. Tuesdays and Fridays    . FLUoxetine (PROZAC) 40 MG capsule Take 40 mg by mouth every evening.    Marland Kitchen HYDROcodone-acetaminophen (NORCO) 5-325 MG tablet Take 1-2 tablets by mouth every 6 (six) hours as needed for moderate pain or severe pain. 20 tablet 0  . levothyroxine (SYNTHROID, LEVOTHROID) 125 MCG tablet Take 125 mcg by mouth daily.      . Menaquinone-7 (VITAMIN K2 PO) Take 1 capsule by mouth 2 (two) times daily.    . naproxen sodium (ALEVE) 220 MG tablet Take 440 mg by mouth daily as needed (pain).    Marland Kitchen OVER THE COUNTER MEDICATION Take 1 Dose by mouth daily.    . Probiotic CAPS Take 1 capsule by mouth 2 (two) times daily.    . tamoxifen (NOLVADEX) 20 MG tablet Take 1 tablet (20 mg total) by mouth daily. 90 tablet 3  . valACYclovir (VALTREX) 1000 MG tablet Take 1,000 mg by mouth every evening.     No current facility-administered medications for this visit.     REVIEW OF SYSTEMS:   Constitutional: Denies fevers, chills or abnormal night sweats Eyes: Denies blurriness of vision, double vision or watery eyes Ears, nose, mouth, throat, and face: Denies mucositis or sore throat Respiratory: Denies cough, dyspnea or wheezes Cardiovascular: Denies palpitation, chest discomfort or lower extremity swelling Gastrointestinal:  Denies nausea, heartburn or change in bowel habits Skin: Denies abnormal skin rashes Lymphatics: Denies new lymphadenopathy or easy bruising Neurological:Denies numbness, tingling or new weaknesses Behavioral/Psych: Mood is stable, no new changes  Breast: Denies any palpable lumps or discharge All other systems were reviewed with the patient and are negative.  PHYSICAL EXAMINATION: ECOG PERFORMANCE STATUS: 0 - Asymptomatic  Vitals:   06/18/18 1604  BP: 130/89  Pulse: 78  Resp: 18  Temp: 98.4 F (36.9 C)  SpO2: 100%   Filed Weights   06/18/18 1604  Weight: 182 lb 14.4 oz (83 kg)    GENERAL:alert, no  distress and comfortable SKIN: skin color, texture, turgor are normal, no rashes or significant lesions EYES: normal, conjunctiva are pink and non-injected, sclera clear OROPHARYNX:no exudate, no erythema and lips, buccal mucosa, and tongue normal  NECK: supple, thyroid normal size, non-tender, without nodularity LYMPH:  no palpable lymphadenopathy in the cervical, axillary or inguinal LUNGS: clear to auscultation and percussion with normal breathing effort HEART: regular rate & rhythm and no murmurs and no lower extremity edema ABDOMEN:abdomen soft, non-tender and normal bowel sounds Musculoskeletal:no cyanosis of digits and no clubbing  PSYCH: alert & oriented x 3 with fluent speech NEURO: no focal motor/sensory deficits BREAST: No palpable nodules in breast. No palpable axillary or supraclavicular lymphadenopathy (exam performed in  the presence of a chaperone)   LABORATORY DATA:  I have reviewed the data as listed Lab Results  Component Value Date   WBC 9.0 05/15/2018   HGB 13.4 05/15/2018   HCT 41.9 05/15/2018   MCV 87.3 05/15/2018   PLT 282 05/15/2018   Lab Results  Component Value Date   NA 136 05/15/2018   K 3.8 05/15/2018   CL 103 05/15/2018   CO2 25 05/15/2018    RADIOGRAPHIC STUDIES: I have personally reviewed the radiological reports and agreed with the findings in the report.  ASSESSMENT AND PLAN:  Ductal carcinoma in situ (DCIS) of left breast 05/05/2018: Screening mammogram detected left breast calcifications 2.3 cm biopsy revealed intraductal papilloma. 05/19/2018:Left lumpectomy: 0.5 cm DCIS intermediate grade, partially involving intraductal papilloma, margins negative, insufficient material for ER and PR testing, Tis NX stage 0  Pathology review: I discussed with the patient the difference between DCIS and invasive breast cancer. It is considered a precancerous lesion. DCIS is classified as a 0. It is generally detected through mammograms as calcifications. We  discussed the significance of grades and its impact on prognosis. We also discussed the importance of ER and PR receptors, which could not be done because of insufficient tissue.  Recommendation: 1. Followed by adjuvant radiation therapy 2. Followed by antiestrogen therapy with tamoxifen 5 years (since majority of DCIS and to be ER PR positive, we discussed the pros and cons of antiestrogen therapy for breast reduction.)  Tamoxifen counseling: We discussed the risks and benefits of tamoxifen. These include but not limited to insomnia, hot flashes, mood changes, vaginal dryness, and weight gain. Although rare, serious side effects including risk of blood clots were also discussed. We strongly believe that the benefits far outweigh the risks. Patient understands these risks and consented to starting treatment. Planned treatment duration is 5 years.  Because of coronavirus, radiation may not be started for several months.  I recommended that we can initiate therapy with tamoxifen to see if she tolerates it.  We discussed recently presented results of TAM-01 study which showed even at 5 mg doses tamoxifen was effective.  We will start her on 10 mg and titrate it appropriately.  I also instructed her to discontinue the estrogen patch.  She will continue the vaginal estrogen cream for vaginal dryness.  All questions were answered. The patient knows to call the clinic with any problems, questions or concerns.   Rulon Eisenmenger, MD 06/18/2018   I, Molly Dorshimer, am acting as scribe for Nicholas Lose, MD.  I have reviewed the above documentation for accuracy and completeness, and I agree with the above.

## 2018-06-18 ENCOUNTER — Other Ambulatory Visit: Payer: Self-pay

## 2018-06-18 ENCOUNTER — Inpatient Hospital Stay: Payer: BLUE CROSS/BLUE SHIELD | Attending: Hematology and Oncology | Admitting: Hematology and Oncology

## 2018-06-18 ENCOUNTER — Encounter: Payer: Self-pay | Admitting: *Deleted

## 2018-06-18 DIAGNOSIS — D0512 Intraductal carcinoma in situ of left breast: Secondary | ICD-10-CM

## 2018-06-18 DIAGNOSIS — F329 Major depressive disorder, single episode, unspecified: Secondary | ICD-10-CM | POA: Insufficient documentation

## 2018-06-18 DIAGNOSIS — Z79899 Other long term (current) drug therapy: Secondary | ICD-10-CM | POA: Diagnosis not present

## 2018-06-18 DIAGNOSIS — E039 Hypothyroidism, unspecified: Secondary | ICD-10-CM | POA: Diagnosis not present

## 2018-06-18 DIAGNOSIS — M129 Arthropathy, unspecified: Secondary | ICD-10-CM | POA: Diagnosis not present

## 2018-06-18 DIAGNOSIS — D649 Anemia, unspecified: Secondary | ICD-10-CM | POA: Diagnosis not present

## 2018-06-18 MED ORDER — TAMOXIFEN CITRATE 20 MG PO TABS: 20.0000 mg | ORAL_TABLET | Freq: Every day | ORAL | 3 refills | Status: DC

## 2018-06-18 NOTE — Assessment & Plan Note (Signed)
05/05/2018: Screening mammogram detected left breast calcifications 2.3 cm biopsy revealed intraductal papilloma. 05/19/2018:Left lumpectomy: 0.5 cm DCIS intermediate grade, partially involving intraductal papilloma, margins negative, insufficient material for ER and PR testing, Tis NX stage 0  Pathology review: I discussed with the patient the difference between DCIS and invasive breast cancer. It is considered a precancerous lesion. DCIS is classified as a 0. It is generally detected through mammograms as calcifications. We discussed the significance of grades and its impact on prognosis. We also discussed the importance of ER and PR receptors, which could not be done because of insufficient tissue.  Recommendation: 1. Followed by adjuvant radiation therapy 2. Followed by antiestrogen therapy with tamoxifen 5 years (since majority of DCIS and to be ER PR positive, we discussed the pros and cons of antiestrogen therapy for breast reduction.)  Tamoxifen counseling: We discussed the risks and benefits of tamoxifen. These include but not limited to insomnia, hot flashes, mood changes, vaginal dryness, and weight gain. Although rare, serious side effects including endometrial cancer, risk of blood clots were also discussed. We strongly believe that the benefits far outweigh the risks. Patient understands these risks and consented to starting treatment. Planned treatment duration is 5 years.  Because of coronavirus, radiation may not be started for several months.  I recommended that we can initiate therapy with tamoxifen to see if she tolerates it.  We discussed recently presented results of TAM-01 study which showed even at 5 mg doses tamoxifen was effective.  We will start her on 10 mg and titrate it appropriately.

## 2018-06-19 ENCOUNTER — Telehealth: Payer: Self-pay | Admitting: Hematology and Oncology

## 2018-06-19 NOTE — Telephone Encounter (Signed)
Tried to reach regarding 10/5 I did leave a message

## 2018-07-16 ENCOUNTER — Ambulatory Visit
Admission: RE | Admit: 2018-07-16 | Payer: BLUE CROSS/BLUE SHIELD | Source: Ambulatory Visit | Admitting: Radiation Oncology

## 2018-07-28 ENCOUNTER — Encounter: Payer: Self-pay | Admitting: *Deleted

## 2018-07-30 ENCOUNTER — Telehealth: Payer: Self-pay | Admitting: *Deleted

## 2018-07-30 NOTE — Telephone Encounter (Signed)
Left message for a call back to reschedule appointment with Dr. Sondra Come.

## 2018-08-06 ENCOUNTER — Telehealth: Payer: Self-pay | Admitting: *Deleted

## 2018-08-06 NOTE — Telephone Encounter (Signed)
Left mLeft message for a call back to reschedule appointment with Dr. Sondra Come.essage for a call back to reschedule appointment with Dr. Sondra Come.

## 2018-08-13 ENCOUNTER — Telehealth: Payer: Self-pay | Admitting: *Deleted

## 2018-08-13 NOTE — Telephone Encounter (Signed)
Left vm for pt to return call regarding next step in tx plan for xrt with Dr. Sondra Come. Contact information provided. Msg also sent to Dr. Dalbert Batman.

## 2018-08-18 ENCOUNTER — Telehealth: Payer: Self-pay | Admitting: *Deleted

## 2018-08-18 NOTE — Telephone Encounter (Signed)
Called and left message for a return phone call. Notified patient of scheduled appointment with Dr. Sondra Come on 08/27/18 @ 12:30 p.m.

## 2018-08-27 ENCOUNTER — Ambulatory Visit: Payer: BC Managed Care – PPO | Admitting: Radiation Oncology

## 2018-08-27 ENCOUNTER — Ambulatory Visit: Payer: BC Managed Care – PPO

## 2018-09-03 ENCOUNTER — Other Ambulatory Visit: Payer: Self-pay

## 2018-09-03 MED ORDER — TAMOXIFEN CITRATE 20 MG PO TABS: 20.0000 mg | ORAL_TABLET | Freq: Every day | ORAL | 3 refills | Status: AC

## 2018-09-04 DIAGNOSIS — L918 Other hypertrophic disorders of the skin: Secondary | ICD-10-CM | POA: Diagnosis not present

## 2018-09-04 DIAGNOSIS — L821 Other seborrheic keratosis: Secondary | ICD-10-CM | POA: Diagnosis not present

## 2018-09-04 DIAGNOSIS — L255 Unspecified contact dermatitis due to plants, except food: Secondary | ICD-10-CM | POA: Diagnosis not present

## 2018-09-04 DIAGNOSIS — I781 Nevus, non-neoplastic: Secondary | ICD-10-CM | POA: Diagnosis not present

## 2018-09-09 NOTE — Progress Notes (Signed)
Location of Breast Cancer: left breast, lower outer quadrant  Histology per Pathology Report: 05/19/18:  Diagnosis 1. Breast, lumpectomy, Left w/seed - DUCTAL CARCINOMA IN SITU, INTERMEDIATE NUCLEAR GRADE, PARTIALLY INVOLVING AN INTRADUCTAL PAPILLOMA. SEE NOTE. - NO EVIDENCE OF INVASIVE CARCINOMA. - DCIS IS 0.3 CM FROM THE JUNCTION OF MEDIAL AND ANTERIOR MARGINS. - BIOPSY SITE CHANGES. - SEE ONCOLOGY TABLE. 2. Breast, excision, Additional tissue at 6 o'clock - BENIGN BREAST PARENCHYMA WITH FIBROCYSTIC CHANGE, INCLUDING APOCRINE CHANGE. - NEGATIVE FOR CARCINOMA. Microscopic Comment 1. DCIS OF THE BREAST: Resection   Receptor Status: 1. There is INSUFFICIENT ductal carcinoma in situ present for analysis.  Did patient present with symptoms (if so, please note symptoms) or was this found on screening mammography?: The cancer was detected on a screening mammogram on 04/27/18. A diagnostic mammogram on 05/05/18 showed a 2.3cm group of calcifications in the left breast. A biopsy on 05/05/18 showed intraductal papilloma. An [place] on 05/11/18 showed no abnormalities associated with palpable thickening in the left axilla  Past/Anticipated interventions by surgeon, if any: 05/19/18: Procedure:                 Left breast lumpectomy with radioactive seed localization                                      Reexcision left breast tissue at 6:00  Surgeon:                     Edsel Petrin. Dalbert Batman, M.D., [facility]    Past/Anticipated interventions by medical oncology, if any: Chemotherapy 06/18/18 Per Dr. Lindi Adie: ASSESSMENT AND PLAN:  Ductal carcinoma in situ (DCIS) of left breast 05/05/2018: Screening mammogram detected left breast calcifications 2.3 cm biopsy revealed intraductal papilloma. 05/19/2018:Left lumpectomy: 0.5 cm DCIS intermediate grade, partially involving intraductal papilloma, margins negative, insufficient material for ER and PR testing, Tis NX stage 0  Pathology review: I discussed with the patient the  difference between DCIS and invasive breast cancer. It is considered a precancerous lesion. DCIS is classified as a 0. It is generally detected through mammograms as calcifications. We discussed the significance of grades and its impact on prognosis. We also discussed the importance of ER and PR receptors, which could not be done because of insufficient tissue.  Recommendation: 1. Followed by adjuvant radiation therapy 2. Followed by antiestrogen therapy with tamoxifen 5 years (since majority of DCIS and to be ER PR positive, we discussed the pros and cons of antiestrogen therapy for breast reduction.)  Tamoxifen counseling: We discussed the risks and benefits of tamoxifen. These include but not limited to insomnia, hot flashes, mood changes, vaginal dryness, and weight gain. Although rare, serious side effects including risk of blood clots were also discussed. We strongly believe that the benefits far outweigh the risks. Patient understands these risks and consented to starting treatment. Planned treatment duration is 5 years.  Because of coronavirus, radiation may not be started for several months.  I recommended that we can initiate therapy with tamoxifen to see if she tolerates it.  We discussed recently presented results of TAM-01 study which showed even at 5 mg doses tamoxifen was effective.  We will start her on 10 mg and titrate it appropriately.  I also instructed her to discontinue the estrogen patch.  She will continue the vaginal estrogen cream for vaginal dryness  Lymphedema issues, if any:  Pt denies any  s/s lymphedema  Pain issues, if any:  Pt denies c/o pain in breast  SAFETY ISSUES:  Prior radiation? No  Pacemaker/ICD? No  Possible current pregnancy? No  Is the patient on methotrexate? No  Current Complaints / other details:  Pt presents today for initial consult with Dr. Sondra Come for Radiation Oncology. Pt has not been taking Tamoxifen and wants to discuss usage with  Dr. Sondra Come.  BP (!) 148/96 (BP Location: Right Arm, Patient Position: Sitting)   Pulse 81   Temp 98.2 F (36.8 C) (Oral)   Resp 18   Ht 5\' 3"  (1.6 m)   Wt 181 lb 9.6 oz (82.4 kg)   SpO2 98%   BMI 32.17 kg/m   Wt Readings from Last 3 Encounters:  09/14/18 181 lb 9.6 oz (82.4 kg)  06/18/18 182 lb 14.4 oz (83 kg)  05/19/18 185 lb 3 oz (84 kg)       Loma Sousa, RN 09/14/2018,7:52 AM

## 2018-09-10 DIAGNOSIS — Z86010 Personal history of colonic polyps: Secondary | ICD-10-CM | POA: Diagnosis not present

## 2018-09-11 DIAGNOSIS — E039 Hypothyroidism, unspecified: Secondary | ICD-10-CM | POA: Diagnosis not present

## 2018-09-11 DIAGNOSIS — E0500 Thyrotoxicosis with diffuse goiter without thyrotoxic crisis or storm: Secondary | ICD-10-CM | POA: Diagnosis not present

## 2018-09-14 ENCOUNTER — Other Ambulatory Visit: Payer: Self-pay

## 2018-09-14 ENCOUNTER — Encounter: Payer: Self-pay | Admitting: Radiation Oncology

## 2018-09-14 ENCOUNTER — Encounter (INDEPENDENT_AMBULATORY_CARE_PROVIDER_SITE_OTHER): Payer: Self-pay

## 2018-09-14 ENCOUNTER — Ambulatory Visit
Admission: RE | Admit: 2018-09-14 | Discharge: 2018-09-14 | Disposition: A | Discharge status: Home / Self Care | Payer: BC Managed Care – PPO | Source: Ambulatory Visit | Attending: Radiation Oncology | Admitting: Radiation Oncology

## 2018-09-14 VITALS — BP 148/96 | HR 81 | Temp 98.2°F | Resp 18 | Ht 63.0 in | Wt 181.6 lb

## 2018-09-14 DIAGNOSIS — F329 Major depressive disorder, single episode, unspecified: Secondary | ICD-10-CM | POA: Diagnosis not present

## 2018-09-14 DIAGNOSIS — Z79899 Other long term (current) drug therapy: Secondary | ICD-10-CM | POA: Insufficient documentation

## 2018-09-14 DIAGNOSIS — Z923 Personal history of irradiation: Secondary | ICD-10-CM | POA: Insufficient documentation

## 2018-09-14 DIAGNOSIS — Z170 Estrogen receptor positive status [ER+]: Secondary | ICD-10-CM | POA: Diagnosis not present

## 2018-09-14 DIAGNOSIS — D0512 Intraductal carcinoma in situ of left breast: Secondary | ICD-10-CM | POA: Insufficient documentation

## 2018-09-14 DIAGNOSIS — Z9889 Other specified postprocedural states: Secondary | ICD-10-CM | POA: Diagnosis not present

## 2018-09-14 DIAGNOSIS — Z79810 Long term (current) use of selective estrogen receptor modulators (SERMs): Secondary | ICD-10-CM | POA: Diagnosis not present

## 2018-09-14 DIAGNOSIS — E039 Hypothyroidism, unspecified: Secondary | ICD-10-CM | POA: Insufficient documentation

## 2018-09-14 DIAGNOSIS — D649 Anemia, unspecified: Secondary | ICD-10-CM | POA: Insufficient documentation

## 2018-09-14 NOTE — Patient Instructions (Signed)
Coronavirus (COVID-19) Are you at risk?  Are you at risk for the Coronavirus (COVID-19)?  To be considered HIGH RISK for Coronavirus (COVID-19), you have to meet the following criteria:  . Traveled to [place], [place], [place], [place] or [place]; or in the [place] to [place], [place], [place], or [place]; and have fever, cough, and shortness of breath within the last 2 weeks of travel OR . Been in close contact with a person diagnosed with COVID-19 within the last 2 weeks and have fever, cough, and shortness of breath . IF YOU DO NOT MEET THESE CRITERIA, YOU ARE CONSIDERED LOW RISK FOR COVID-19.  What to do if you are HIGH RISK for COVID-19?  . If you are having a medical emergency, call 911. . Seek medical care right away. Before you go to a doctor's office, urgent care or emergency department, call ahead and tell them about your recent travel, contact with someone diagnosed with COVID-19, and your symptoms. You should receive instructions from your physician's office regarding next steps of care.  . When you arrive at healthcare provider, tell the healthcare staff immediately you have returned from visiting [place], [place], [place], [place] or [place]; or traveled in the [place] to [place], [place], [place], or [place]; in the last two weeks or you have been in close contact with a person diagnosed with COVID-19 in the last 2 weeks.   . Tell the health care staff about your symptoms: fever, cough and shortness of breath. . After you have been seen by a medical provider, you will be either: o Tested for (COVID-19) and discharged home on quarantine except to seek medical care if symptoms worsen, and asked to  - Stay home and avoid contact with others until you get your results (4-5 days)  - Avoid travel on public transportation if possible (such as bus, train, or airplane) or o Sent to the Emergency Department by EMS for evaluation, COVID-19 testing, and possible  admission depending on your condition and test results.  What to do if you are LOW RISK for COVID-19?  Reduce your risk of any infection by using the same precautions used for avoiding the common cold or flu:  . Wash your hands often with soap and warm water for at least 20 seconds.  If soap and water are not readily available, use an alcohol-based hand sanitizer with at least 60% alcohol.  . If coughing or sneezing, cover your mouth and nose by coughing or sneezing into the elbow areas of your shirt or coat, into a tissue or into your sleeve (not your hands). . Avoid shaking hands with others and consider head nods or verbal greetings only. . Avoid touching your eyes, nose, or mouth with unwashed hands.  . Avoid close contact with people who are sick. . Avoid places or events with large numbers of people in one location, like concerts or sporting events. . Carefully consider travel plans you have or are making. . If you are planning any travel outside or inside the [place], visit the CDC's Travelers' Health webpage for the latest health notices. . If you have some symptoms but not all symptoms, continue to monitor at home and seek medical attention if your symptoms worsen. . If you are having a medical emergency, call 911.   ADDITIONAL HEALTHCARE OPTIONS FOR PATIENTS  [facility] / e-Visit: [email or web address]         [facility]: [phone]  [facility]: [phone]                   [facility]: [phone]   

## 2018-09-14 NOTE — Progress Notes (Signed)
Radiation Oncology         [phone] ________________________________  Initial Outpatient Consultation  Name: Teresa Floyd MRN: [id number]  Date: 09/14/2018  DOB: Jul 19, 1955  [id number], Bill Salinas, MD  Nicholas Lose, MD   REFERRING PHYSICIAN: Nicholas Lose, MD  DIAGNOSIS: The encounter diagnosis was Ductal carcinoma in situ (DCIS) of left breast.   Stage 0 (pTix, pNx) Left Breast DCIS, grade 2, ER, PR status unknown  HISTORY OF PRESENT ILLNESS::Teresa Floyd is a 63 y.o. female who presented for routine screening mammography on 04/27/2018 showing: calcifications in the left breast. She underwent left diagnostic mammography on 05/05/2018 showing: a 2.3 cm area of indeterminate microcalficiations in the lower slight upper left breast.    The patient underwent a biopsy on 05/05/2018 showing: intraductal papilloma with calcifications; fibrocystic changes with calcifications; no malignancy detected.  She opted to proceed with left breast lumpectomy on 05/19/2018. Pathology from the procedure showed: ductal carcinoma in situ, intermediate nuclear grade, partially involving an intraductal papilloma; no evidence of invasive carcinoma. A discrete lesion was not seen grossly, and there was insufficient DCIS for immunohistochemistry.   She met with Dr. Lindi Adie on 06/18/2018 to discuss her treatment plan. She was started on tamoxifen due to the delay in beginning radiation caused by the coronavirus. Today, however, she reports she has not been taking tamoxifen.  She has kindly been referred to [place] to discuss radiation therapy treatment options.   PREVIOUS RADIATION THERAPY: No  PAST MEDICAL HISTORY:  has a past medical history of Anemia, Arthritis, Depression, Genital herpes, Hypothyroidism, Leukocytosis, Papilloma of left breast (05/19/2018), and PONV (postoperative nausea and vomiting).    PAST SURGICAL HISTORY: Past Surgical History:  Procedure Laterality Date  . ABDOMINAL HYSTERECTOMY     . BREAST LUMPECTOMY WITH RADIOACTIVE SEED LOCALIZATION Left 05/19/2018   Procedure: LEFT BREAST LUMPECTOMY WITH RADIOACTIVE SEED LOCALIZATION;  Surgeon: Fanny Skates, MD;  Location: [facility];  Service: General;  Laterality: Left;  . [place]  . COLONOSCOPY      FAMILY HISTORY: family history is not on file.  SOCIAL HISTORY:  reports that she has never smoked. She has never used smokeless tobacco. She reports that she does not drink alcohol or use drugs.  ALLERGIES: Patient has no known allergies.  MEDICATIONS:  Current Outpatient Medications  Medication Sig Dispense Refill  . estradiol (ESTRACE) 0.1 MG/GM vaginal cream Place 1 Applicatorful vaginally once a week.     . estradiol (VIVELLE-DOT) 0.05 MG/24HR Place 1 patch onto the skin 2 (two) times a week. Tuesdays and Fridays    . FLUoxetine (PROZAC) 40 MG capsule Take 40 mg by mouth every evening.    Marland Kitchen levothyroxine (SYNTHROID, LEVOTHROID) 125 MCG tablet Take 125 mcg by mouth daily.      . Menaquinone-7 (VITAMIN K2 PO) Take 1 capsule by mouth 2 (two) times daily.    . Probiotic CAPS Take 1 capsule by mouth 2 (two) times daily.    . valACYclovir (VALTREX) 1000 MG tablet Take 1,000 mg by mouth every evening.    Marland Kitchen HYDROcodone-acetaminophen (NORCO) 5-325 MG tablet Take 1-2 tablets by mouth every 6 (six) hours as needed for moderate pain or severe pain. (Patient not taking: Reported on 09/14/2018) 20 tablet 0  . naproxen sodium (ALEVE) 220 MG tablet Take 440 mg by mouth daily as needed (pain).    Marland Kitchen OVER THE COUNTER MEDICATION Take 1 Dose by mouth daily.    . tamoxifen (NOLVADEX)  20 MG tablet Take 1 tablet (20 mg total) by mouth daily. (Patient not taking: Reported on 09/14/2018) 90 tablet 3   No current facility-administered medications for this encounter.     REVIEW OF SYSTEMS:  A 10+ POINT REVIEW OF SYSTEMS WAS OBTAINED including neurology, dermatology, psychiatry, cardiac, respiratory, lymph, extremities, GI, GU,  musculoskeletal, constitutional, reproductive, HEENT. She denies any symptoms of lymphedema and any pain in the breast.   PHYSICAL EXAM:  height is '5\' 3"'$  (1.6 m) and weight is 181 lb 9.6 oz (82.4 kg). Her oral temperature is 98.2 F (36.8 C). Her blood pressure is 148/96 (abnormal) and her pulse is 81. Her respiration is 18 and oxygen saturation is 98%.   General: Alert and oriented, in no acute distress HEENT: Head is normocephalic. Extraocular movements are intact. Oropharynx is clear. Neck: Neck is supple, no palpable cervical or supraclavicular lymphadenopathy. Heart: Regular in rate and rhythm with no murmurs, rubs, or gallops. Chest: Clear to auscultation bilaterally, with no rhonchi, wheezes, or rales. Abdomen: Soft, nontender, nondistended, with no rigidity or guarding. Extremities: No cyanosis or edema. Lymphatics: see Neck Exam Skin: No concerning lesions. Musculoskeletal: symmetric strength and muscle tone throughout. Neurologic: Cranial nerves II through XII are grossly intact. No obvious focalities. Speech is fluent. Coordination is intact. Psychiatric: Judgment and insight are intact. Affect is appropriate. Right breast large,  pendulous, no nipple discharge or bleeding no palpable mass; left breast large and pendulous.  There is a barely perceptible scar in the lateral left inframammary fold area.  Scar is well-healed.  No signs of drainage or infection.  No palpable mass within the breast nipple discharge or bleeding.  ECOG = 0  LABORATORY DATA:  Lab Results  Component Value Date   WBC 9.0 05/15/2018   HGB 13.4 05/15/2018   HCT 41.9 05/15/2018   MCV 87.3 05/15/2018   PLT 282 05/15/2018   NEUTROABS 4.5 05/15/2018   Lab Results  Component Value Date   NA 136 05/15/2018   K 3.8 05/15/2018   CL 103 05/15/2018   CO2 25 05/15/2018   GLUCOSE 92 05/15/2018   CREATININE 0.60 05/15/2018   CALCIUM 9.4 05/15/2018      RADIOGRAPHY: No results found.    IMPRESSION: Stage  0 (pTix, pNx) Left Breast DCIS, grade 2, ER, PR status unknown.  Patient was found to have a small area of DCIS extending over approximately 5 mm.  There was not adequate tissue to perform estrogen and progesterone receptors.  Patient did meet with Dr. Lindi Adie who discussed these issues in detail.  The patient was given a prescription for tamoxifen but she is not started taking this medication yet.  She wanted to discuss radiation therapy as an option.  I discussed in detail the course of treatment side effects and potential long-term toxicities of radiation therapy with left-sided breast cancer treatment.  We also discussed potential cardiac sparing techniques.  I discussed with the patient that her risk for recurrence in the breast is low and that radiation therapy in addition to adjuvant hormonal therapy would improve local control a few percent but would not have impact on overall survival for the patient..  We also discussed that if she decided not to proceed with adjuvant hormonal therapy that I would strongly encourage radiation therapy in that situation.  We also discussed that we do not know the estrogen progesterone receptor status of her tumor but in general most  stage 0 breast cancers are ER PR positive.  At  this time the patient will likely proceed with hormonal therapy alone.    PLAN: Patient will call back if she wishes to proceed with radiation therapy as part of her overall treatment.  Patient does live in [place] which is approximately 2 hours from here but does work in the [place] area and has an apartment in this area should she on radiation therapy.  Patient has not stopped her estrogen patch or vaginal estrogen but will do so in the near future when she proceeds with tamoxifen.  I discussed with patient that she may have significant side effects in this situation and if she is unable to tolerate Tamoxifen then we will strongly need to reconsider radiation therapy.     ------------------------------------------------  Blair Promise, PhD, MD  This document serves as a record of services personally performed by Gery Pray, MD. It was created on his behalf by Wilburn Mylar, a trained medical scribe. The creation of this record is based on the scribe's personal observations and the provider's statements to them. This document has been checked and approved by the attending provider.

## 2018-09-21 ENCOUNTER — Encounter: Payer: Self-pay | Admitting: *Deleted

## 2018-09-25 DIAGNOSIS — Z1239 Encounter for other screening for malignant neoplasm of breast: Secondary | ICD-10-CM | POA: Diagnosis not present

## 2018-09-25 DIAGNOSIS — R030 Elevated blood-pressure reading, without diagnosis of hypertension: Secondary | ICD-10-CM | POA: Diagnosis not present

## 2018-09-25 DIAGNOSIS — C50512 Malignant neoplasm of lower-outer quadrant of left female breast: Secondary | ICD-10-CM | POA: Diagnosis not present

## 2018-09-25 DIAGNOSIS — E669 Obesity, unspecified: Secondary | ICD-10-CM | POA: Diagnosis not present

## 2018-09-29 DIAGNOSIS — D0582 Other specified type of carcinoma in situ of left breast: Secondary | ICD-10-CM | POA: Diagnosis not present

## 2018-10-01 DIAGNOSIS — D0582 Other specified type of carcinoma in situ of left breast: Secondary | ICD-10-CM | POA: Diagnosis not present

## 2018-11-02 DIAGNOSIS — Z808 Family history of malignant neoplasm of other organs or systems: Secondary | ICD-10-CM | POA: Diagnosis not present

## 2018-11-02 DIAGNOSIS — Z801 Family history of malignant neoplasm of trachea, bronchus and lung: Secondary | ICD-10-CM | POA: Diagnosis not present

## 2018-11-02 DIAGNOSIS — Z01419 Encounter for gynecological examination (general) (routine) without abnormal findings: Secondary | ICD-10-CM | POA: Diagnosis not present

## 2018-11-02 DIAGNOSIS — D0512 Intraductal carcinoma in situ of left breast: Secondary | ICD-10-CM | POA: Diagnosis not present

## 2018-11-02 DIAGNOSIS — Z13820 Encounter for screening for osteoporosis: Secondary | ICD-10-CM | POA: Diagnosis not present

## 2018-11-02 DIAGNOSIS — Z6832 Body mass index (BMI) 32.0-32.9, adult: Secondary | ICD-10-CM | POA: Diagnosis not present

## 2018-11-20 DIAGNOSIS — Z1239 Encounter for other screening for malignant neoplasm of breast: Secondary | ICD-10-CM | POA: Diagnosis not present

## 2018-11-20 DIAGNOSIS — C50512 Malignant neoplasm of lower-outer quadrant of left female breast: Secondary | ICD-10-CM | POA: Diagnosis not present

## 2018-11-20 DIAGNOSIS — E669 Obesity, unspecified: Secondary | ICD-10-CM | POA: Diagnosis not present

## 2018-11-20 DIAGNOSIS — Z13820 Encounter for screening for osteoporosis: Secondary | ICD-10-CM | POA: Diagnosis not present

## 2018-12-03 ENCOUNTER — Encounter: Payer: Self-pay | Admitting: *Deleted

## 2018-12-20 DIAGNOSIS — R1084 Generalized abdominal pain: Secondary | ICD-10-CM | POA: Diagnosis not present

## 2018-12-21 ENCOUNTER — Ambulatory Visit: Payer: BLUE CROSS/BLUE SHIELD | Admitting: Hematology and Oncology

## 2018-12-22 DIAGNOSIS — Z0001 Encounter for general adult medical examination with abnormal findings: Secondary | ICD-10-CM | POA: Diagnosis not present

## 2019-01-05 DIAGNOSIS — Z809 Family history of malignant neoplasm, unspecified: Secondary | ICD-10-CM | POA: Diagnosis not present

## 2019-01-15 DIAGNOSIS — E039 Hypothyroidism, unspecified: Secondary | ICD-10-CM | POA: Diagnosis not present

## 2019-01-15 DIAGNOSIS — E049 Nontoxic goiter, unspecified: Secondary | ICD-10-CM | POA: Diagnosis not present

## 2019-01-15 DIAGNOSIS — E559 Vitamin D deficiency, unspecified: Secondary | ICD-10-CM | POA: Diagnosis not present

## 2019-01-20 DIAGNOSIS — R1032 Left lower quadrant pain: Secondary | ICD-10-CM | POA: Diagnosis not present

## 2019-01-20 DIAGNOSIS — K5730 Diverticulosis of large intestine without perforation or abscess without bleeding: Secondary | ICD-10-CM | POA: Diagnosis not present

## 2019-02-19 DIAGNOSIS — R030 Elevated blood-pressure reading, without diagnosis of hypertension: Secondary | ICD-10-CM | POA: Diagnosis not present

## 2019-02-19 DIAGNOSIS — Z1239 Encounter for other screening for malignant neoplasm of breast: Secondary | ICD-10-CM | POA: Diagnosis not present

## 2019-02-19 DIAGNOSIS — C50512 Malignant neoplasm of lower-outer quadrant of left female breast: Secondary | ICD-10-CM | POA: Diagnosis not present

## 2019-02-19 DIAGNOSIS — E669 Obesity, unspecified: Secondary | ICD-10-CM | POA: Diagnosis not present

## 2019-03-09 ENCOUNTER — Encounter: Payer: Self-pay | Admitting: *Deleted

## 2019-05-28 DIAGNOSIS — R030 Elevated blood-pressure reading, without diagnosis of hypertension: Secondary | ICD-10-CM | POA: Diagnosis not present

## 2019-05-28 DIAGNOSIS — C50512 Malignant neoplasm of lower-outer quadrant of left female breast: Secondary | ICD-10-CM | POA: Diagnosis not present

## 2019-05-28 DIAGNOSIS — E669 Obesity, unspecified: Secondary | ICD-10-CM | POA: Diagnosis not present

## 2019-05-28 DIAGNOSIS — Z1239 Encounter for other screening for malignant neoplasm of breast: Secondary | ICD-10-CM | POA: Diagnosis not present

## 2019-07-16 DIAGNOSIS — E049 Nontoxic goiter, unspecified: Secondary | ICD-10-CM | POA: Diagnosis not present

## 2019-07-16 DIAGNOSIS — E039 Hypothyroidism, unspecified: Secondary | ICD-10-CM | POA: Diagnosis not present

## 2019-08-26 DIAGNOSIS — I4892 Unspecified atrial flutter: Secondary | ICD-10-CM | POA: Diagnosis not present

## 2019-08-26 DIAGNOSIS — R002 Palpitations: Secondary | ICD-10-CM | POA: Diagnosis not present

## 2019-08-26 DIAGNOSIS — I4891 Unspecified atrial fibrillation: Secondary | ICD-10-CM | POA: Diagnosis not present

## 2019-08-26 DIAGNOSIS — R05 Cough: Secondary | ICD-10-CM | POA: Diagnosis not present

## 2019-08-26 DIAGNOSIS — D72829 Elevated white blood cell count, unspecified: Secondary | ICD-10-CM | POA: Diagnosis not present

## 2019-08-26 DIAGNOSIS — R000 Tachycardia, unspecified: Secondary | ICD-10-CM | POA: Diagnosis not present

## 2019-08-26 DIAGNOSIS — I482 Chronic atrial fibrillation, unspecified: Secondary | ICD-10-CM | POA: Diagnosis not present

## 2019-08-26 DIAGNOSIS — E039 Hypothyroidism, unspecified: Secondary | ICD-10-CM | POA: Diagnosis not present

## 2019-08-26 DIAGNOSIS — Z20822 Contact with and (suspected) exposure to covid-19: Secondary | ICD-10-CM | POA: Diagnosis not present

## 2019-08-26 DIAGNOSIS — R42 Dizziness and giddiness: Secondary | ICD-10-CM | POA: Diagnosis not present

## 2019-08-26 DIAGNOSIS — Z79899 Other long term (current) drug therapy: Secondary | ICD-10-CM | POA: Diagnosis not present

## 2019-08-26 DIAGNOSIS — R450 Nervousness: Secondary | ICD-10-CM | POA: Diagnosis not present

## 2019-08-27 DIAGNOSIS — I361 Nonrheumatic tricuspid (valve) insufficiency: Secondary | ICD-10-CM | POA: Diagnosis not present

## 2019-08-27 DIAGNOSIS — I4891 Unspecified atrial fibrillation: Secondary | ICD-10-CM | POA: Diagnosis not present

## 2019-09-27 DIAGNOSIS — I480 Paroxysmal atrial fibrillation: Secondary | ICD-10-CM | POA: Diagnosis not present

## 2019-10-26 DIAGNOSIS — I480 Paroxysmal atrial fibrillation: Secondary | ICD-10-CM | POA: Diagnosis not present

## 2019-11-19 DIAGNOSIS — D235 Other benign neoplasm of skin of trunk: Secondary | ICD-10-CM | POA: Diagnosis not present

## 2019-11-19 DIAGNOSIS — M79671 Pain in right foot: Secondary | ICD-10-CM | POA: Diagnosis not present

## 2019-11-19 DIAGNOSIS — Z1239 Encounter for other screening for malignant neoplasm of breast: Secondary | ICD-10-CM | POA: Diagnosis not present

## 2019-11-19 DIAGNOSIS — L988 Other specified disorders of the skin and subcutaneous tissue: Secondary | ICD-10-CM | POA: Diagnosis not present

## 2019-11-19 DIAGNOSIS — C50512 Malignant neoplasm of lower-outer quadrant of left female breast: Secondary | ICD-10-CM | POA: Diagnosis not present

## 2019-11-19 DIAGNOSIS — E669 Obesity, unspecified: Secondary | ICD-10-CM | POA: Diagnosis not present

## 2019-11-19 DIAGNOSIS — G8929 Other chronic pain: Secondary | ICD-10-CM | POA: Diagnosis not present

## 2019-11-19 DIAGNOSIS — R21 Rash and other nonspecific skin eruption: Secondary | ICD-10-CM | POA: Diagnosis not present

## 2019-12-01 DIAGNOSIS — M7671 Peroneal tendinitis, right leg: Secondary | ICD-10-CM | POA: Diagnosis not present

## 2019-12-01 DIAGNOSIS — M79671 Pain in right foot: Secondary | ICD-10-CM | POA: Diagnosis not present

## 2019-12-02 DIAGNOSIS — L309 Dermatitis, unspecified: Secondary | ICD-10-CM | POA: Diagnosis not present

## 2020-07-31 IMAGING — MG DIGITAL SCREENING BILATERAL MAMMOGRAM WITH TOMO AND CAD
8 series · 8 of 24 positions shown · non-contrast
Comparison: Previous exam(s).

CLINICAL DATA: Screening.

EXAM:
DIGITAL SCREENING BILATERAL MAMMOGRAM WITH TOMO AND CAD

[L CC synth-2D]
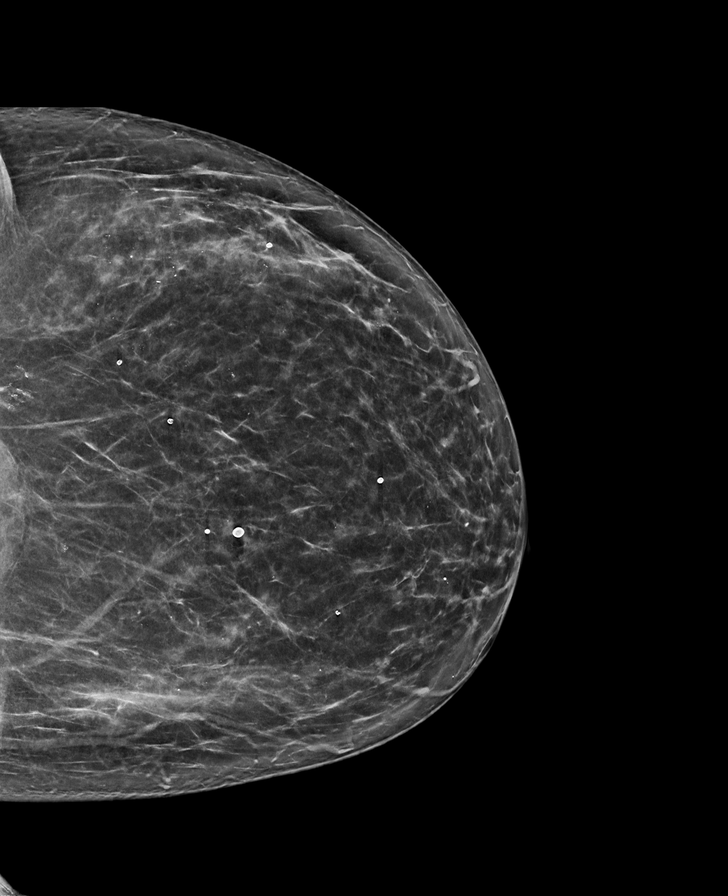

[L MLO synth-2D]
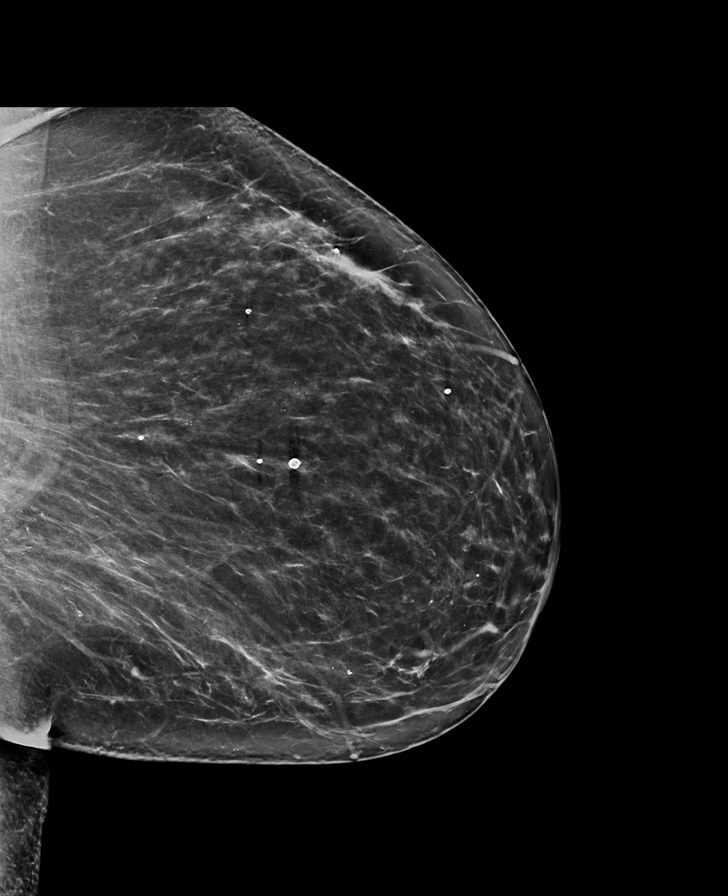

[R MLO synth-2D]
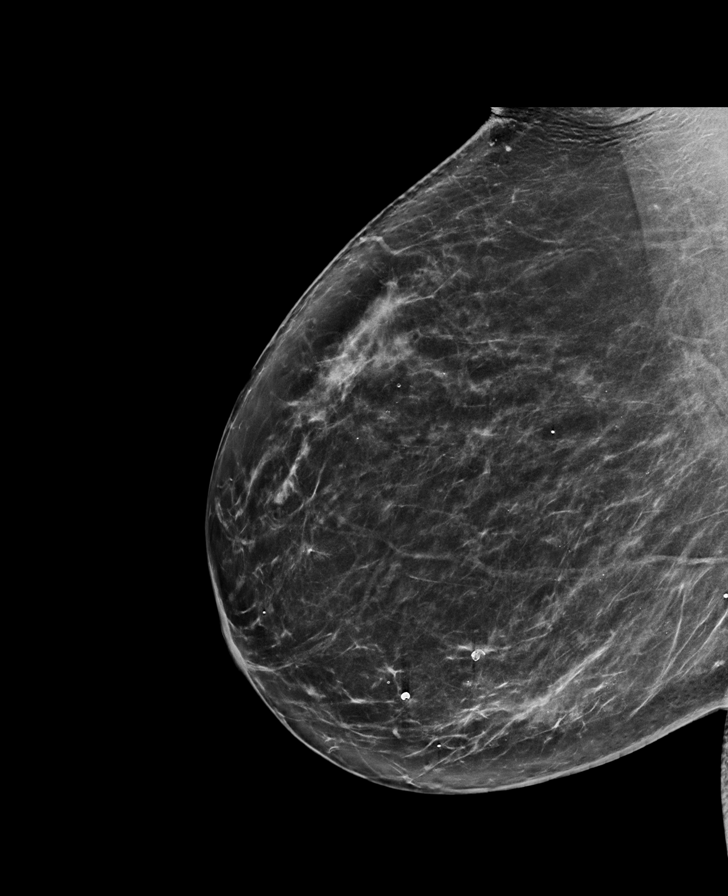

[R CC synth-2D]
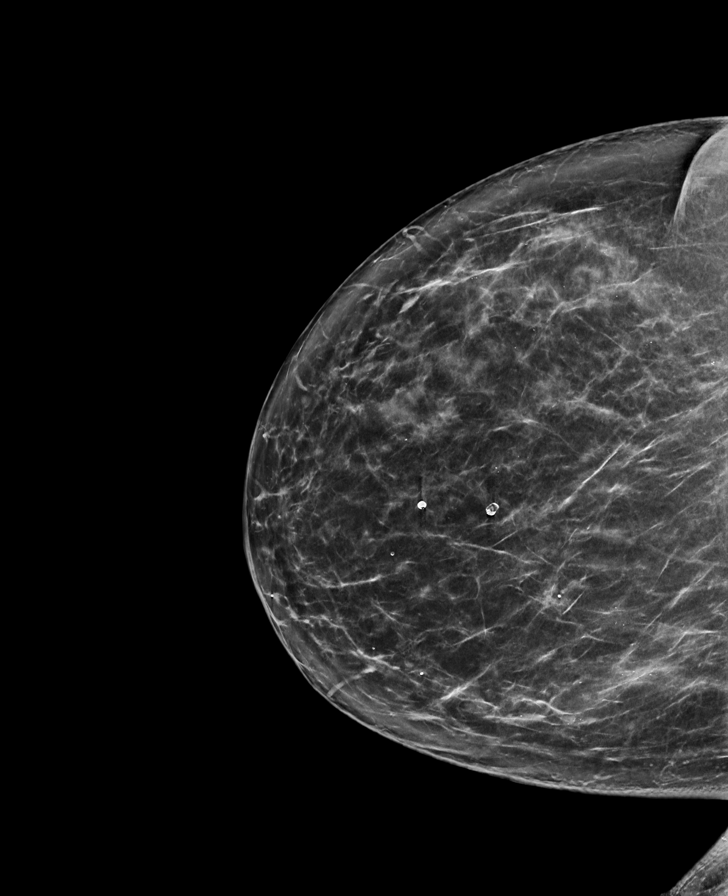

[L MLO tomo · tomo slice 45/89.0]
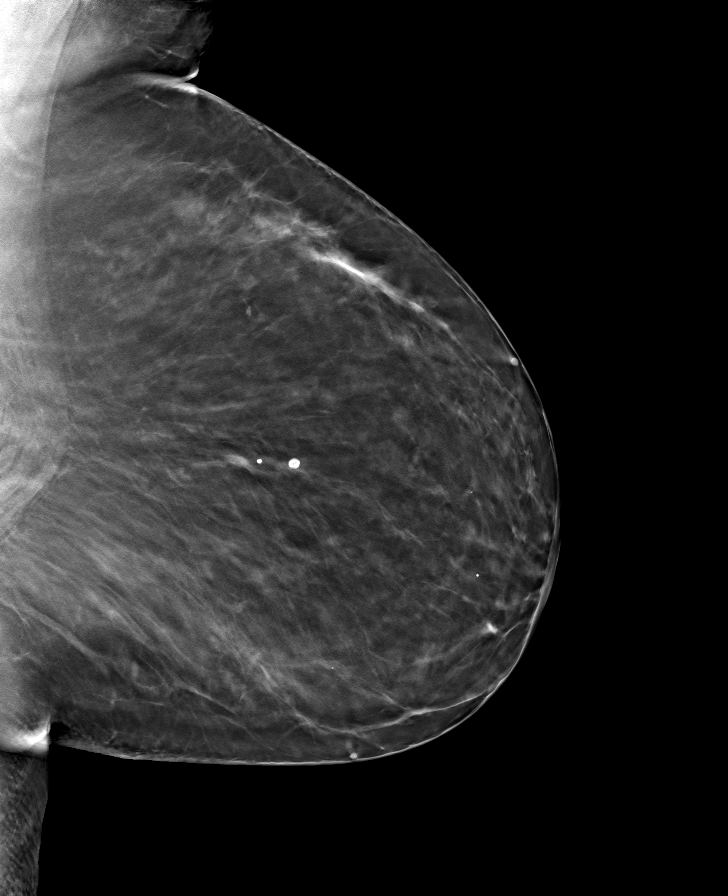

[L CC tomo · tomo slice 42/83.0]
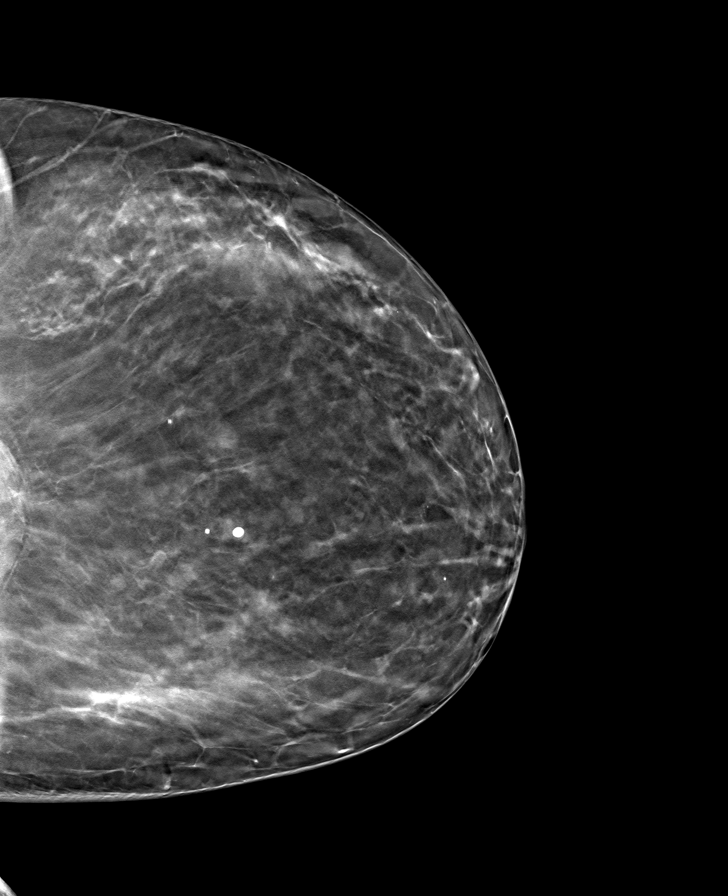

[R CC tomo · tomo slice 43/85.0]
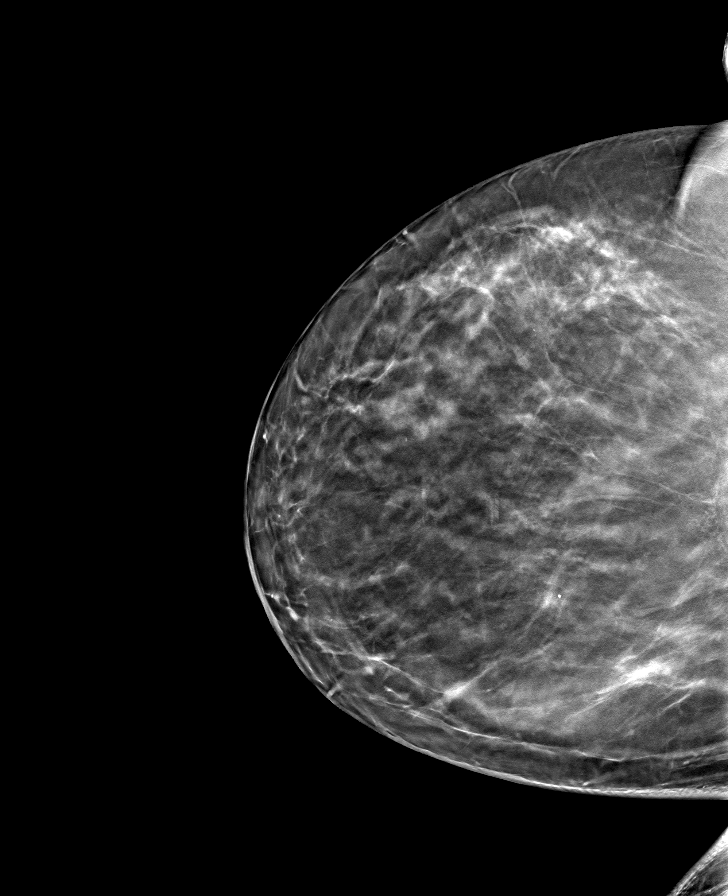

[R MLO tomo · tomo slice 45/89.0]
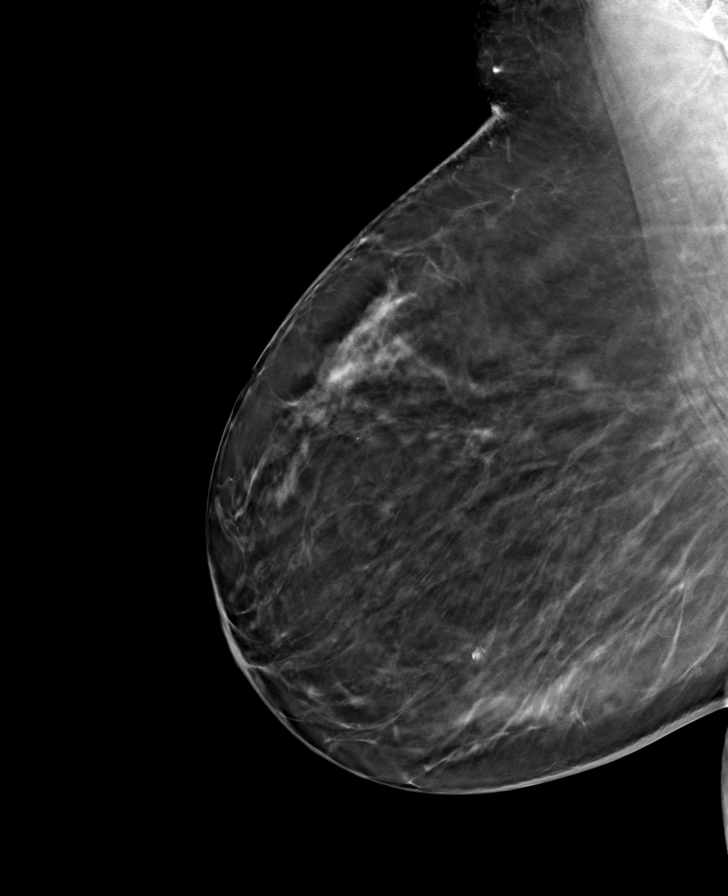

[8 of 24 positions shown; findings below may reference images not displayed]

ACR Breast Density Category c: The breast tissue is heterogeneously
dense, which may obscure small masses.
FINDINGS: In the left breast, calcifications warrant further evaluation. In
the right breast, no findings suspicious for malignancy. Images were
processed with CAD.
IMPRESSION: Further evaluation is suggested for calcifications in the left
breast.

RECOMMENDATION:
Diagnostic mammogram of the left breast. (Code:[id number])

The patient will be contacted regarding the findings, and additional
imaging will be scheduled.

BI-RADS CATEGORY  0: Incomplete. Need additional imaging evaluation
and/or prior mammograms for comparison.
